# Patient Record
Sex: Male | Born: 1960 | Race: White | Hispanic: No | Marital: Married | Smoking: Former smoker
Health system: Southern US, Community
[De-identification: ages and names within clinical notes are randomized; demographics above are authoritative.]

## PROBLEM LIST (undated history)

## (undated) DIAGNOSIS — K219 Gastro-esophageal reflux disease without esophagitis: Secondary | ICD-10-CM

## (undated) DIAGNOSIS — M5136 Other intervertebral disc degeneration, lumbar region: Secondary | ICD-10-CM

## (undated) DIAGNOSIS — F1910 Other psychoactive substance abuse, uncomplicated: Secondary | ICD-10-CM

## (undated) DIAGNOSIS — E785 Hyperlipidemia, unspecified: Secondary | ICD-10-CM

## (undated) DIAGNOSIS — L719 Rosacea, unspecified: Secondary | ICD-10-CM

## (undated) DIAGNOSIS — S7290XA Unspecified fracture of unspecified femur, initial encounter for closed fracture: Secondary | ICD-10-CM

## (undated) DIAGNOSIS — T7840XA Allergy, unspecified, initial encounter: Secondary | ICD-10-CM

## (undated) DIAGNOSIS — S43006A Unspecified dislocation of unspecified shoulder joint, initial encounter: Secondary | ICD-10-CM

## (undated) DIAGNOSIS — G44009 Cluster headache syndrome, unspecified, not intractable: Secondary | ICD-10-CM

## (undated) DIAGNOSIS — J381 Polyp of vocal cord and larynx: Secondary | ICD-10-CM

## (undated) DIAGNOSIS — M51369 Other intervertebral disc degeneration, lumbar region without mention of lumbar back pain or lower extremity pain: Secondary | ICD-10-CM

## (undated) DIAGNOSIS — S83206A Unspecified tear of unspecified meniscus, current injury, right knee, initial encounter: Secondary | ICD-10-CM

## (undated) HISTORY — DX: Unspecified tear of unspecified meniscus, current injury, right knee, initial encounter: S83.206A

## (undated) HISTORY — DX: Unspecified fracture of unspecified femur, initial encounter for closed fracture: S72.90XA

## (undated) HISTORY — PX: COLONOSCOPY: SHX174

## (undated) HISTORY — PX: ARTHROSCOPIC REPAIR ACL: SUR80

## (undated) HISTORY — DX: Unspecified dislocation of unspecified shoulder joint, initial encounter: S43.006A

## (undated) HISTORY — DX: Rosacea, unspecified: L71.9

## (undated) HISTORY — DX: Other intervertebral disc degeneration, lumbar region: M51.36

## (undated) HISTORY — PX: ESOPHAGOGASTRODUODENOSCOPY (EGD) WITH ESOPHAGEAL DILATION: SHX5812

## (undated) HISTORY — DX: Hyperlipidemia, unspecified: E78.5

## (undated) HISTORY — PX: OTHER SURGICAL HISTORY: SHX169

## (undated) HISTORY — DX: Gastro-esophageal reflux disease without esophagitis: K21.9

## (undated) HISTORY — DX: Allergy, unspecified, initial encounter: T78.40XA

## (undated) HISTORY — DX: Polyp of vocal cord and larynx: J38.1

## (undated) HISTORY — DX: Other psychoactive substance abuse, uncomplicated: F19.10

## (undated) HISTORY — DX: Cluster headache syndrome, unspecified, not intractable: G44.009

## (undated) HISTORY — PX: UPPER GASTROINTESTINAL ENDOSCOPY: SHX188

## (undated) HISTORY — DX: Other intervertebral disc degeneration, lumbar region without mention of lumbar back pain or lower extremity pain: M51.369

---

## 1999-12-15 ENCOUNTER — Emergency Department (HOSPITAL_COMMUNITY): Admission: EM | Admit: 1999-12-15 | Discharge: 1999-12-15 | Payer: Self-pay | Admitting: Emergency Medicine

## 2003-12-13 ENCOUNTER — Ambulatory Visit: Payer: Self-pay | Admitting: Internal Medicine

## 2003-12-17 ENCOUNTER — Ambulatory Visit: Payer: Self-pay | Admitting: Internal Medicine

## 2004-04-16 ENCOUNTER — Ambulatory Visit: Payer: Self-pay | Admitting: Internal Medicine

## 2004-12-26 ENCOUNTER — Ambulatory Visit: Payer: Self-pay | Admitting: Internal Medicine

## 2006-11-17 ENCOUNTER — Encounter: Payer: Self-pay | Admitting: *Deleted

## 2006-11-17 DIAGNOSIS — S83509A Sprain of unspecified cruciate ligament of unspecified knee, initial encounter: Secondary | ICD-10-CM | POA: Insufficient documentation

## 2006-11-17 DIAGNOSIS — Z87898 Personal history of other specified conditions: Secondary | ICD-10-CM

## 2006-11-17 DIAGNOSIS — Z872 Personal history of diseases of the skin and subcutaneous tissue: Secondary | ICD-10-CM | POA: Insufficient documentation

## 2006-11-17 DIAGNOSIS — J301 Allergic rhinitis due to pollen: Secondary | ICD-10-CM | POA: Insufficient documentation

## 2006-11-17 DIAGNOSIS — S7290XA Unspecified fracture of unspecified femur, initial encounter for closed fracture: Secondary | ICD-10-CM | POA: Insufficient documentation

## 2006-11-17 DIAGNOSIS — K219 Gastro-esophageal reflux disease without esophagitis: Secondary | ICD-10-CM

## 2006-11-17 DIAGNOSIS — Z9189 Other specified personal risk factors, not elsewhere classified: Secondary | ICD-10-CM | POA: Insufficient documentation

## 2006-12-20 ENCOUNTER — Ambulatory Visit: Payer: Self-pay | Admitting: Internal Medicine

## 2006-12-20 DIAGNOSIS — R7989 Other specified abnormal findings of blood chemistry: Secondary | ICD-10-CM | POA: Insufficient documentation

## 2006-12-20 LAB — CONVERTED CEMR LAB
CO2: 28 meq/L (ref 19–32)
Calcium: 9.5 mg/dL (ref 8.4–10.5)
Chloride: 104 meq/L (ref 96–112)
Cholesterol: 196 mg/dL (ref 0–200)
Creatinine, Ser: 0.9 mg/dL (ref 0.4–1.5)
Glucose, Bld: 98 mg/dL (ref 70–99)
HDL: 31 mg/dL — ABNORMAL LOW (ref >39.0)
Sodium: 140 meq/L (ref 135–145)

## 2007-01-11 ENCOUNTER — Telehealth: Payer: Self-pay | Admitting: Internal Medicine

## 2007-08-20 ENCOUNTER — Encounter: Payer: Self-pay | Admitting: Internal Medicine

## 2007-08-23 ENCOUNTER — Telehealth: Payer: Self-pay | Admitting: Internal Medicine

## 2009-05-07 ENCOUNTER — Encounter: Payer: Self-pay | Admitting: Internal Medicine

## 2009-06-26 ENCOUNTER — Ambulatory Visit: Payer: Self-pay | Admitting: Internal Medicine

## 2009-06-26 LAB — CONVERTED CEMR LAB
ALT: 47 units/L (ref 0–53)
AST: 25 units/L (ref 0–37)
BUN: 14 mg/dL (ref 6–23)
Basophils Relative: 0.7 % (ref 0.0–3.0)
CO2: 31 meq/L (ref 19–32)
Calcium: 9.1 mg/dL (ref 8.4–10.5)
Chloride: 108 meq/L (ref 96–112)
Creatinine, Ser: 0.9 mg/dL (ref 0.4–1.5)
Eosinophils Relative: 2.1 % (ref 0.0–5.0)
Glucose, Bld: 89 mg/dL (ref 70–99)
HCT: 41.9 % (ref 39.0–52.0)
Hemoglobin: 14.6 g/dL (ref 13.0–17.0)
Ketones, ur: NEGATIVE mg/dL
Lymphs Abs: 1.7 10*3/uL (ref 0.7–4.0)
Monocytes Relative: 8.9 % (ref 3.0–12.0)
Platelets: 224 10*3/uL (ref 150.0–400.0)
RBC: 4.43 M/uL (ref 4.22–5.81)
Specific Gravity, Urine: 1.025 (ref 1.000–1.030)
TSH: 4 microintl units/mL (ref 0.35–5.50)
Total Bilirubin: 0.6 mg/dL (ref 0.3–1.2)
Total CHOL/HDL Ratio: 6
Total Protein: 6.6 g/dL (ref 6.0–8.3)
Triglycerides: 49 mg/dL (ref 0.0–149.0)
Urine Glucose: NEGATIVE mg/dL
Urobilinogen, UA: 0.2 (ref 0.0–1.0)
WBC: 5.2 10*3/uL (ref 4.5–10.5)
pH: 6 (ref 5.0–8.0)

## 2009-06-27 ENCOUNTER — Ambulatory Visit: Payer: Self-pay | Admitting: Internal Medicine

## 2009-06-27 DIAGNOSIS — N509 Disorder of male genital organs, unspecified: Secondary | ICD-10-CM | POA: Insufficient documentation

## 2009-08-02 ENCOUNTER — Encounter: Payer: Self-pay | Admitting: Internal Medicine

## 2010-02-25 NOTE — Assessment & Plan Note (Signed)
Summary: CPX/ NWS  #   Vital Signs:  Patient profile:   50 year old male Height:      70 inches (177.80 cm) Weight:      173.8 pounds (79 kg) BMI:     25.03 O2 Sat:      98 % on Room air Temp:     98.7 degrees F (37.06 degrees C) oral Pulse rate:   69 / minute BP sitting:   112 / 84  (left arm) Cuff size:   regular  Vitals Entered By: Orlan Leavens (June 27, 2009 2:28 PM)  O2 Flow:  Room air CC: CPX Is Patient Diabetic? No Pain Assessment Patient in pain? no        Primary Care Provider:  [facility]  CC:  CPX.  History of Present Illness: Patient presents for routine exam. In the interval he has had ACL right repair. He has got a "murder-cycle." He has otherwise been doing well.   Current Medications (verified): 1)  Fluticasone Propionate 50 Mcg/act Susp (Fluticasone Propionate) .... As Needed 2)  Minocycline Hcl 100 Mg  Tabs (Minocycline Hcl) .... Take One Tablet Once Daily 3)  Plexion Cleanser 10-5 %  Emul (Sulfacetamide Sodium-Sulfur) .... Use Daily 4)  Azelex 20 %  Crea (Azelaic Acid) .... Use Daily 5)  Loratadine 10 Mg  Tabs (Loratadine) .... Take One Tablet As Needed 6)  Prevacid Solutab 30 Mg  Tbdp (Lansoprazole) .Marland Kitchen.. 1 Once Daily  Allergies (verified): No Known Drug Allergies  Past History:  Past Medical History: Last updated: 11/17/2006 DRUG ABUSE, HX OF (ICD-V15.89) VOCAL CORD POLYP, HX OF (ICD-V13.8) HEADACHES, HX OF (ICD-V13.8) Hx of FRACTURE, FEMUR (ICD-821.00) Hx of ACL TEAR, RIGHT KNEE (ICD-844.2) ACNE ROSACEA, HX OF (ICD-V13.3) ALLERGIC RHINITIS, SEASONAL (ICD-477.0) GERD (ICD-530.81)    Family History: Last updated: 12/20/2006 father- prostate cancer, Lipids, CAD/CABG, Repair AAA, accidental  death at 63 Mother - dementia, OA-s/p RHR brother- good health 33 sister- good health 47  Social History: 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         he has fond a small hard nodule left testicle-inferior location  Physical Exam  General:  Well-developed,well-nourished,in no acute distress; alert,appropriate and cooperative throughout examination Head:  Normocephalic and atraumatic without obvious abnormalities. No apparent alopecia or balding. Eyes:  No corneal or conjunctival inflammation noted. EOMI. Perrla. Funduscopic exam benign, without hemorrhages, exudates or papilledema. Vision grossly normal. Ears:  External ear exam shows no significant lesions or deformities.  Otoscopic examination reveals clear canals, tympanic membranes are intact bilaterally without bulging, retraction, inflammation or discharge. Hearing is grossly normal bilaterally. Nose:  no external deformity and no external erythema.   Mouth:  Oral mucosa and oropharynx without lesions or exudates.  Teeth in good repair. Neck:  supple, no masses, no thyromegaly, and no carotid bruits.   Chest Wall:  No deformities, masses, tenderness or gynecomastia noted. Lungs:  Normal respiratory effort, chest expands symmetrically. Lungs are clear to auscultation, no crackles or wheezes. Heart:  Normal rate and regular rhythm. S1 and S2 normal without gallop, murmur, click, rub or other  extra sounds. Abdomen:  soft, non-tender, normal bowel sounds, no abdominal hernia, no inguinal hernia, and no hepatomegaly.   Rectal:  No external abnormalities noted. Normal sphincter tone. No rectal masses or tenderness. Genitalia:  right testicle normal. Left testicle  normal, normal vas, just inferior to the testicle is a 0.5 cm hard nodule which does feel attached to the testicle. Prostate:  Prostate gland firm and smooth, no enlargement, nodularity, tenderness, mass, asymmetry or induration. Msk:  normal ROM, no joint tenderness, no joint swelling, and no joint deformities.   Pulses:  2+ radial pulses Extremities:  No clubbing, cyanosis, edema, or deformity noted with normal full range of motion of all joints.   Neurologic:  No cranial nerve deficits noted. Station and gait are normal. Plantar reflexes are down-going bilaterally. DTRs are symmetrical throughout. Sensory, motor and coordinative functions appear intact. Skin:  turgor normal, color normal, no rashes, and no suspicious lesions.   Cervical Nodes:  no anterior cervical adenopathy and no posterior cervical adenopathy.   Inguinal Nodes:  no R inguinal adenopathy and no L inguinal adenopathy.   Psych:  Oriented X3, memory intact for recent and remote, normally interactive, good eye contact, and not anxious appearing.     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 Suspect a benign lesion but low degree of certainty.  Plan refer to Dr. Annabell Howells for GU evaluation  Orders: Urology Referral (Urology)  Problem # 2:  GERD (ICD-530.81) Doing OK. He has treid to stop prevacid and had terrible heartburn. Discussed the known acid rebound with cessation of PPI therapy.  Plan - drug holiday from PPI therapy: start H2 blocker, e.g. ranitidine 150 mg AM/PM, 1week prior to stopping prevacie. Stopp prevacid and watch for symptoms. If none then reduce ranitidine to at bedtime for several weeks then try taking only as needed.   His updated medication list for this problem includes:    Prevacid Solutab 30 Mg Tbdp (Lansoprazole) .Marland Kitchen... 1 once daily  Problem # 3:  OTHER ABNORMAL  BLOOD CHEMISTRY (ICD-790.6) Patient with elevated LDL at 156. Reviewed NCEP guidelines. He feels he is doing all he can do with diet and exercise.  Plan - trial of low dose lovastatin 20mg  once daily.           repeat lab in 4 weeks.   Problem # 4:  Preventive Health Care (ICD-V70.0) Medical history without new problems. He had a normal physical exam. Lab, except for LDL cholesterol, is within normal limits. Discussed the need for reular exercise and fitness.  In summary- a very nice man who is medically stable except for scrotal nodule and moderately elevated LDL cholesteol with plan as outlined above.  Complete Medication List: 1)  Fluticasone Propionate 50 Mcg/act Susp (Fluticasone propionate) .... As needed 2)  Minocycline Hcl 100 Mg Tabs (Minocycline hcl) .... Take one tablet once daily 3)  Plexion Cleanser 10-5 % Emul (Sulfacetamide sodium-sulfur) .... Use daily 4)  Azelex 20 % Crea (Azelaic acid) .... Use daily 5)  Loratadine 10 Mg Tabs (Loratadine) .... Take one tablet as needed 6)  Prevacid Solutab 30 Mg Tbdp (Lansoprazole) .Marland Kitchen.. 1 once daily 7)  Lovastatin 20 Mg Tabs (Lovastatin) .Marland Kitchen.. 1 by mouth qpm  Other Orders: Tdap => 62yrs IM (16109) Admin 1st Vaccine (60454) EKG w/ Interpretation (93000)  ient: Ajdin Hargan Note: All result statuses are Final unless  otherwise noted.  Tests: (1) BMP (METABOL)   Sodium                    144 mEq/L                   135-145   Potassium                 4.2 mEq/L                   3.5-5.1   Chloride                  108 mEq/L                   96-112   Carbon Dioxide            31 mEq/L                    19-32   Glucose                   89 mg/dL                    16-10   BUN                       14 mg/dL                    9-60   Creatinine                0.9 mg/dL                   4.5-4.0   Calcium                   9.1 mg/dL                   9.8-11.9   GFR                       97.52 mL/min                >60  Tests: (2)  Lipid Panel (LIPID)   Cholesterol          [H]  206 mg/dL                   1-478     ATP III Classification            Desirable:  < 200 mg/dL                    Borderline High:  200 - 239 mg/dL               High:  > = 240 mg/dL   Triglycerides             49.0 mg/dL                  2.9-562.1     Normal:  <150 mg/dL     Borderline High:  308 - 199 mg/dL   HDL                  [L]  65.78 mg/dL                 >46.96   VLDL Cholesterol  9.8 mg/dL                   1.6-10.9  CHO/HDL Ratio:  CHD Risk                             6                    Men          Women     1/2 Average Risk     3.4          3.3     Average Risk          5.0          4.4     2X Average Risk          9.6          7.1     3X Average Risk          15.0          11.0                           Tests: (3) CBC Platelet w/Diff (CBCD)   White Cell Count          5.2 K/uL                    4.5-10.5   Red Cell Count            4.43 Mil/uL                 4.22-5.81   Hemoglobin                14.6 g/dL                   60.4-54.0   Hematocrit                41.9 %                      39.0-52.0   MCV                       94.6 fl                     78.0-100.0   MCHC                      34.8 g/dL                   98.1-19.1   RDW                       12.6 %                      11.5-14.6   Platelet Count            224.0 K/uL                  150.0-400.0   Neutrophil %              55.9 %                      43.0-77.0   Lymphocyte %  32.4 %                      12.0-46.0   Monocyte %                8.9 %                       3.0-12.0   Eosinophils%              2.1 %                       0.0-5.0   Basophils %               0.7 %                       0.0-3.0   Neutrophill Absolute      2.9 K/uL                    1.4-7.7   Lymphocyte Absolute       1.7 K/uL                    0.7-4.0   Monocyte Absolute         0.5 K/uL                    0.1-1.0  Eosinophils, Absolute                              0.1 K/uL                    0.0-0.7   Basophils Absolute        0.0 K/uL                    0.0-0.1  Tests: (4) Hepatic/Liver Function Panel (HEPATIC)   Total Bilirubin           0.6 mg/dL                   4.0-3.4   Direct Bilirubin          0.1 mg/dL                   7.4-2.5   Alkaline Phosphatase      67 U/L                      39-117   AST                       25 U/L                      0-37   ALT                       47 U/L                      0-53   Total Protein             6.6 g/dL                    9.5-6.3   Albumin  4.3 g/dL                    0.2-7.2  Tests: (5) TSH (TSH)   FastTSH                   4.00 uIU/mL                 0.35-5.50  Tests: (6) Prostate Specific Antigen (PSA)   PSA-Hyb                   0.29 ng/mL                  0.10-4.00  Tests: (7) UDip Only (UDIP)   Color                     YELLOW       RANGE:  Yellow;Lt. Yellow   Clarity                   CLEAR                       Clear   Specific Gravity          1.025                       1.000 - 1.030   Urine Ph                  6.0                         5.0-8.0   Protein                   NEGATIVE                    Negative   Urine Glucose             NEGATIVE                    Negative   Ketones                   NEGATIVE                    Negative   Urine Bilirubin           NEGATIVE                    Negative   Blood                     NEGATIVE                    Negative   Urobilinogen              0.2                         0.0 - 1.0   Leukocyte Esterace        NEGATIVE                    Negative   Nitrite                   NEGATIVE  Negative  Tests: (8) Cholesterol LDL - Direct (DIRLDL)  Cholesterol LDL - Direct                             156.3 mg/dL     Optimal:  <295 mg/dL     Near or Above Optimal:  100-129 mg/dL     Borderline High:  621-308 mg/dL     High:  657-846 mg/dL     Very High:  >962 mg/dLPrescriptions: FLUTICASONE  PROPIONATE 50 MCG/ACT SUSP (FLUTICASONE PROPIONATE) as needed  #21mo x 12   Entered and Authorized by:   Jacques Navy MD   Signed by:   Jacques Navy MD on 06/27/2009   Method used:   Electronically to        [facility]  [address] [phone]* (retail)       [address]       [place], [place]  [address]       Ph: [phone]       Fax: [phone]   RxID:   [id number] LOVASTATIN 20 MG TABS (LOVASTATIN) 1 by mouth QPM  #30 x 12   Entered and Authorized by:   Jacques Navy MD   Signed by:   Jacques Navy MD on 06/27/2009   Method used:   Electronically to        [facility]  [address] [phone]* (retail)       [address]       [place], [place]  [address]       Ph: [phone]       Fax: [phone]   RxID:   [id number]    Immunizations Administered:  Tetanus Vaccine:    Vaccine Type: Tdap    Site: right deltoid    Mfr: GlaxoSmithKline    Dose: 0.5 ml    Route: IM    Given by: Orlan Leavens    Exp. Date: 04/20/2011    Lot #: [id number]    VIS given: 06/27/09

## 2010-02-25 NOTE — Letter (Signed)
Summary: Email from patient/[facility] Carroll  Email from patient/[facility] Carroll   Imported By: Edwin Carroll 05/22/2009 09:52:15  _____________________________________________________________________  External Attachment:    Type:   Image     Comment:   External Document

## 2010-02-25 NOTE — Consult Note (Signed)
Summary: [facility]   Imported By: Sherian Rein 08/09/2009 12:15:43  _____________________________________________________________________  External Attachment:    Type:   Image     Comment:   External Document

## 2010-04-07 ENCOUNTER — Telehealth: Payer: Self-pay | Admitting: Internal Medicine

## 2010-04-07 ENCOUNTER — Encounter: Payer: Self-pay | Admitting: Internal Medicine

## 2010-04-07 ENCOUNTER — Ambulatory Visit (INDEPENDENT_AMBULATORY_CARE_PROVIDER_SITE_OTHER): Payer: BC Managed Care – PPO | Admitting: Internal Medicine

## 2010-04-07 DIAGNOSIS — K9089 Other intestinal malabsorption: Secondary | ICD-10-CM

## 2010-04-09 ENCOUNTER — Other Ambulatory Visit: Payer: Self-pay | Admitting: Internal Medicine

## 2010-04-09 DIAGNOSIS — K909 Intestinal malabsorption, unspecified: Secondary | ICD-10-CM

## 2010-04-15 NOTE — Assessment & Plan Note (Signed)
Summary: bowel changes/Nausea/SD   Vital Signs:  Patient profile:   50 year old male Height:      70 inches Weight:      174 pounds BMI:     25.06 O2 Sat:      98 % on Room air Temp:     97.5 degrees F oral Pulse rate:   66 / minute BP sitting:   122 / 70  (left arm) Cuff size:   regular  Vitals Entered By: Bill Salinas CMA (April 07, 2010 4:14 PM)  O2 Flow:  Room air CC: pt has had c/o nausea with dizziness he also has c/o soft stools with an unusual odor/ ab   Primary Care Provider:  Aeon Koors  CC:  pt has had c/o nausea with dizziness he also has c/o soft stools with an unusual odor/ ab.  History of Present Illness: Mr. Gautier presents with a 4-6 week h/o oily, odifierous stools. He has had mild intermiitent indigestion. He denies any fatty food intolerance, right upper quadrant abdominal pain, right scapular pain. He has had no fever, melena, hematochezia. He has no prior GI history.   Current Medications (verified): 1)  Fluticasone Propionate 50 Mcg/act Susp (Fluticasone Propionate) .... As Needed 2)  Minocycline Hcl 100 Mg  Tabs (Minocycline Hcl) .... Take One Tablet Once Daily 3)  Plexion Cleanser 10-5 %  Emul (Sulfacetamide Sodium-Sulfur) .... Use Daily 4)  Azelex 20 %  Crea (Azelaic Acid) .... Use Daily 5)  Loratadine 10 Mg  Tabs (Loratadine) .... Take One Tablet As Needed 6)  Prevacid Solutab 30 Mg  Tbdp (Lansoprazole) .Marland Kitchen.. 1 Once Daily 7)  Lovastatin 20 Mg Tabs (Lovastatin) .Marland Kitchen.. 1 By Mouth Qpm  Allergies (verified): No Known Drug Allergies  Past History:  Past Medical History: Last updated: 11/17/2006 DRUG ABUSE, HX OF (ICD-V15.89) VOCAL CORD POLYP, HX OF (ICD-V13.8) HEADACHES, HX OF (ICD-V13.8) Hx of FRACTURE, FEMUR (ICD-821.00) Hx of ACL TEAR, RIGHT KNEE (ICD-844.2) ACNE ROSACEA, HX OF (ICD-V13.3) ALLERGIC RHINITIS, SEASONAL (ICD-477.0) GERD (ICD-530.81)    Past Surgical History: Last updated: 06/27/2009 excision vocal chord polyp ACL repair '11  Francena Hanly) FH reviewed for relevance, SH/Risk Factors reviewed for relevance  Review of Systems  The patient denies anorexia, fever, weight loss, weight gain, decreased hearing, peripheral edema, abdominal pain, melena, hematochezia, severe indigestion/heartburn, muscle weakness, abnormal bleeding, and enlarged lymph nodes.    Physical Exam  General:  Well-developed,well-nourished,in no acute distress; alert,appropriate and cooperative throughout examination Head:  normocephalic and atraumatic.   Eyes:  C&S clear without icterus Lungs:  normal respiratory effort.   Heart:  normal rate and regular rhythm.   Abdomen:  Hypoactive BS, minimal tenderness to deep palpation RUQ, minimal tenderness to percussion over the RUQ. No hepatomegaly. Neurologic:  alert & oriented X3 and gait normal.   Skin:  turgor normal and color normal.   Psych:  Oriented X3, normally interactive, and good eye contact.     Impression & Recommendations:  Problem # 1:  STEATORRHEA (ICD-579.8)  Patient with history/description of steatorrhea. He has minimal RUQ tenderness. Concern is for possible gallbladder disease vs malabsorption. No findings or stigmata of liver disease on today's exam.  Plan - GB u/s, if negative HIDA scan.           if above negative - stool for quantitative fat analysis           if above negative - GI lab series including hepatic panel  May come to GI consult.  Orders: Radiology Referral (Radiology)  Complete Medication List: 1)  Fluticasone Propionate 50 Mcg/act Susp (Fluticasone propionate) .... As needed 2)  Minocycline Hcl 100 Mg Tabs (Minocycline hcl) .... Take one tablet once daily 3)  Plexion Cleanser 10-5 % Emul (Sulfacetamide sodium-sulfur) .... Use daily 4)  Azelex 20 % Crea (Azelaic acid) .... Use daily 5)  Loratadine 10 Mg Tabs (Loratadine) .... Take one tablet as needed 6)  Prevacid Solutab 30 Mg Tbdp (Lansoprazole) .Marland Kitchen.. 1 once daily 7)  Lovastatin 20 Mg  Tabs (Lovastatin) .Marland Kitchen.. 1 by mouth qpm   Orders Added: 1)  Est. Patient Level III [10272] 2)  Radiology Referral [Radiology]

## 2010-04-15 NOTE — Progress Notes (Signed)
Summary: OV?   Phone Note Call from Patient Call back at [facility] Phone [phone]   Summary of Call: pt left vm - having some "stomach issues" feels he might need office visit?  Initial call taken by: Lamar Sprinkles, CMA,  April 07, 2010 12:48 PM  Follow-up for Phone Call        Spoke w/pt - he c/o months of mild nausea and change in stools. Has approx 2 stools daily that are soft and have strong odor. Overall feels "ok" but concerned about symptoms. Scheduled for office visit this pm.  Follow-up by: Lamar Sprinkles, CMA,  April 07, 2010 12:59 PM  Additional Follow-up for Phone Call Additional follow up Details #1::        k Additional Follow-up by: Jacques Navy MD,  April 07, 2010 1:13 PM

## 2010-04-16 ENCOUNTER — Ambulatory Visit
Admission: RE | Admit: 2010-04-16 | Discharge: 2010-04-16 | Disposition: A | Discharge status: Home / Self Care | Payer: BC Managed Care – PPO | Source: Ambulatory Visit | Attending: Internal Medicine | Admitting: Internal Medicine

## 2010-04-16 DIAGNOSIS — K909 Intestinal malabsorption, unspecified: Secondary | ICD-10-CM

## 2010-04-20 ENCOUNTER — Telehealth: Payer: Self-pay | Admitting: Internal Medicine

## 2010-04-20 DIAGNOSIS — K909 Intestinal malabsorption, unspecified: Secondary | ICD-10-CM

## 2010-04-20 NOTE — Telephone Encounter (Signed)
Patient with steatorrhea with normal GB u/s except for polyp. Next step is to evaluate gallbladder function.  Please call patient- GB u/s pretty normal except for polyp. Will order nuclear med study for gall bladder function next as discussed.

## 2010-04-21 ENCOUNTER — Telehealth: Payer: Self-pay | Admitting: *Deleted

## 2010-04-21 NOTE — Telephone Encounter (Signed)
Pt aware of u/s results, see other tele note

## 2010-04-21 NOTE — Telephone Encounter (Signed)
Pt aware.

## 2010-05-07 ENCOUNTER — Encounter (HOSPITAL_COMMUNITY)
Admission: RE | Admit: 2010-05-07 | Discharge: 2010-05-07 | Disposition: A | Discharge status: Home / Self Care | Payer: BC Managed Care – PPO | Source: Ambulatory Visit | Attending: Internal Medicine | Admitting: Internal Medicine

## 2010-05-07 DIAGNOSIS — K9089 Other intestinal malabsorption: Secondary | ICD-10-CM | POA: Insufficient documentation

## 2010-05-07 DIAGNOSIS — K909 Intestinal malabsorption, unspecified: Secondary | ICD-10-CM

## 2010-05-07 MED ORDER — SINCALIDE 5 MCG IJ SOLR: 0.0200 ug/kg | Freq: Once | INTRAMUSCULAR | Status: DC

## 2010-05-07 MED ORDER — TECHNETIUM TC 99M MEBROFENIN IV KIT
4.9000 | PACK | Freq: Once | INTRAVENOUS | Status: AC | PRN
  Administered 2010-05-07: 5 via INTRAVENOUS

## 2010-05-15 ENCOUNTER — Telehealth: Payer: Self-pay

## 2010-05-15 NOTE — Telephone Encounter (Signed)
Patient called requesting lab results from last week regarding gallbladder

## 2010-05-15 NOTE — Telephone Encounter (Signed)
Hepato-biliary scan was normal: normal gallbladder function: no evidence of gallbladder disease.

## 2010-05-16 NOTE — Telephone Encounter (Signed)
Left detailed vm on hm # (ok per HIPPA)

## 2010-05-16 NOTE — Telephone Encounter (Signed)
He can have a rov as needed: if his pain gets worse or persists an undue period of time

## 2010-05-16 NOTE — Telephone Encounter (Signed)
Patient notified and would like to know when you want to see him back. I looked at the 04/07/10 OV but not sure what to advise. Thanks

## 2010-05-27 HISTORY — PX: LUMBAR MICRODISCECTOMY: SHX99

## 2010-06-08 ENCOUNTER — Telehealth: Payer: Self-pay | Admitting: Internal Medicine

## 2010-06-08 NOTE — Telephone Encounter (Signed)
Please call patient - normal hepatobiliary scan. If he has continued discomfort will refer to GI

## 2010-06-09 NOTE — Telephone Encounter (Signed)
Informed pt of results.

## 2010-07-22 ENCOUNTER — Other Ambulatory Visit: Payer: Self-pay | Admitting: Internal Medicine

## 2010-09-30 ENCOUNTER — Encounter: Payer: Self-pay | Admitting: Internal Medicine

## 2010-09-30 ENCOUNTER — Encounter: Payer: Self-pay | Admitting: Gastroenterology

## 2010-09-30 ENCOUNTER — Ambulatory Visit (INDEPENDENT_AMBULATORY_CARE_PROVIDER_SITE_OTHER): Payer: BC Managed Care – PPO | Admitting: Internal Medicine

## 2010-09-30 DIAGNOSIS — R1310 Dysphagia, unspecified: Secondary | ICD-10-CM

## 2010-09-30 MED ORDER — ESOMEPRAZOLE MAGNESIUM 40 MG PO CPDR: 40.0000 mg | DELAYED_RELEASE_CAPSULE | Freq: Two times a day (BID) | ORAL | Status: DC

## 2010-09-30 NOTE — Progress Notes (Signed)
  Subjective:    Patient ID: Edwin Carroll, male    DOB: November 23, 1960, 50 y.o.   MRN: [id number]  HPI Patient presents for report of solid food dysphagia. He has had 5 episodes in 5 weeks, including the need to regurgitate. He cannot swallow liquids in the midst of a problem. He has a long history of GERD on PPI.   Past Medical History  Diagnosis Date  . Drug abuse   . Vocal cord polyp   . Headaches, cluster   . Femur fracture   . Tear of cartilage of right knee   . Rosacea   . Allergic rhinitis   . GERD (gastroesophageal reflux disease)    Past Surgical History  Procedure Date  . Excision vocal cord polyp   . Arthroscopic repair acl     Francena Hanly '11   Family History  Problem Relation Age of Onset  . Cancer Father   . Hyperlipidemia Father    History   Social History  . Marital Status: Married    Spouse Name: N/A    Number of Children: N/A  . Years of Education: N/A   Occupational History  . Not on file.   Social History Main Topics  . Smoking status: Not on file  . Smokeless tobacco: Not on file  . Alcohol Use: Not on file  . Drug Use: Not on file  . Sexually Active: Not on file   Other Topics Concern  . Not on file   Social History Narrative   AA- counsellingMarried 7 years-divorced, Married 2001Daughter 1982, son 1985Bicycles, plays tennisWork: Journalist, newspaper      Review of Systems System review is negative for any constitutional, cardiac, pulmonary,  or neuro symptoms or complaints     Objective:   Physical Exam Vitals stable Gen'l - WNWD white man in no distress Cor - RRR, 2+ pulses Abd - BS + Resp - normal       Assessment & Plan:  Dysphagia - patient with solid food dysphagia most likely due to GERD. He has had choking episodes.  Plan - refer to GI - to see Dr. Christella Hartigan Friday, Sept 7th           Given careful instructions about eating to avoid impaction.

## 2010-09-30 NOTE — Patient Instructions (Signed)
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 Plan - continue current medication. Very small bites, chew well, eat slowly and follow with sips of water. Will request an urgent appointment with GI - Sept 7th @ 11:00 with Dr. Wendall Papa   Esophageal Stricture (Narrowing) The esophagus is the long, narrow tube which carries food and liquid from the mouth to the stomach. Sometimes a part of the esophagus becomes narrow and makes it difficult, painful, or even impossible to swallow. This is called an esophageal stricture.   SYMPTOMS Some of the problems are difficulty swallowing or pain with swallowing. CAUSES Common causes of blockage or strictures of the esophagus are:  Exposure of the lower esophagus to the acid from the stomach may cause narrowing.   Hiatal hernia in which a small part of the stomach bulges up through the diaphragm can cause a narrowing in the bottom of the esophagus.   Scleroderma is a tissue disorder that affects the esophagus and makes swallowing difficult.   Achalasia is an absence of nerves in the lower esophagus and to the esophageal sphincter. This absence of nerves may be congenital (present since birth). This can cause irregular spasms which do not allow food and fluid through.   Strictures may develop from swallowing materials which damage the esophagus. Examples are acids or alkalis such as lye.   Schatzki's Ring is a narrow ring of non-cancerous tissue which narrows the lower esophagus. The cause of this is unknown.   Growths can block the esophagus.  DIAGNOSIS Your caregiver often suspects this problem by taking a medical history. They will also do a physical exam. They may then take X-rays and/or perform an endoscopy. Endoscopy is an exam in which a tube like a small flexible telescope is used to look at your esophagus.   TREATMENT AND PROCEDURE  One form of treatment is to dilate the narrow area. This  means to stretch it.   When this is not successful, chest surgery may be required. This is a much more extensive form of treatment with a longer recovery time.  Both of the above treatments make the passage of food and water into the stomach easier. They also make it easier for stomach contents to bubble back into the esophagus. Special medications may be used following the procedure to help prevent further narrowing. Medications may be used to lower the amount of acid in the stomach juice.   SEEK IMMEDIATE MEDICAL CARE IF:  Your swallowing is becoming more painful, difficult, or you are unable to swallow.   You vomit up blood.   You develop black tarry stools.   You develop chills or an unexplained fever of over 101 F (38.3 C).   You develop chest or abdominal pain.   You develop shortness of breath, feel lightheaded, or faint.  Follow up with medical care as your caregiver suggests. Document Released: 09/22/2005 Document Re-Released: 11/09/2006 [facility] Patient Information 2011 [place], [place].

## 2010-10-01 ENCOUNTER — Telehealth: Payer: Self-pay

## 2010-10-01 NOTE — Telephone Encounter (Signed)
Pt has been scheduled for EGD at [facility] on 10/02/10 he has been instructed and meds reviwed appt for 10/03/10 cx.

## 2010-10-01 NOTE — Telephone Encounter (Signed)
Message copied by Donata Duff on Wed Oct 01, 2010  2:35 PM ------      Message from: Rob Bunting P      Created: Wed Oct 01, 2010 11:23 AM       Gibril Mastro,            I read Dr. Alvera Novel H and P from yesterday, this patient is going to need EGD, likely with dilation.  He is scheduled to see me in office on Friday but can you call him to cancel that appt and instead I'd like to go straight to EGD at [facility] tomorrow (doesn't need propofol).  Can you call to see if he can make it and where he can fit into my schedule tomorrow.            Thanks                  Derryl Harbor.  I don't want him to have to wait too long for the EGD so we're going to streamline this a bit. Thanks for forwarding your note from yesterday.            dj

## 2010-10-02 ENCOUNTER — Telehealth: Payer: Self-pay

## 2010-10-02 ENCOUNTER — Ambulatory Visit (HOSPITAL_COMMUNITY)
Admission: RE | Admit: 2010-10-02 | Discharge: 2010-10-02 | Disposition: A | Discharge status: Home / Self Care | Payer: BC Managed Care – PPO | Source: Ambulatory Visit | Attending: Gastroenterology | Admitting: Gastroenterology

## 2010-10-02 ENCOUNTER — Encounter: Payer: BC Managed Care – PPO | Admitting: Gastroenterology

## 2010-10-02 DIAGNOSIS — K222 Esophageal obstruction: Secondary | ICD-10-CM | POA: Insufficient documentation

## 2010-10-02 DIAGNOSIS — K2100 Gastro-esophageal reflux disease with esophagitis, without bleeding: Secondary | ICD-10-CM | POA: Insufficient documentation

## 2010-10-02 DIAGNOSIS — Z79899 Other long term (current) drug therapy: Secondary | ICD-10-CM | POA: Insufficient documentation

## 2010-10-02 DIAGNOSIS — R1310 Dysphagia, unspecified: Secondary | ICD-10-CM

## 2010-10-02 NOTE — Telephone Encounter (Signed)
Message copied by Donata Duff on Thu Oct 02, 2010  3:27 PM ------      Message from: Rob Bunting P      Created: Thu Oct 02, 2010 12:46 PM       Rowin Bayron, can you set him up with repeat EGD in 2-3 weeks, [facility] for esophageal stricture, repeat dilation            Thanks                  Kathlene November,      FYI, no cancer.  Just a peptic related narrowing, esopahgitis.

## 2010-10-02 NOTE — Telephone Encounter (Signed)
I will call pt on Monday and get the EGD set up.  Pt had procedure today and is not available to schedule.

## 2010-10-03 ENCOUNTER — Ambulatory Visit: Payer: BC Managed Care – PPO | Admitting: Gastroenterology

## 2010-10-10 ENCOUNTER — Encounter: Payer: Self-pay | Admitting: Gastroenterology

## 2010-10-10 NOTE — Telephone Encounter (Signed)
Pt has been scheduled and instructed for repeat EGD with DIL letter mailed

## 2010-10-11 ENCOUNTER — Emergency Department (HOSPITAL_COMMUNITY)
Admission: EM | Admit: 2010-10-11 | Discharge: 2010-10-12 | Disposition: A | Discharge status: Home / Self Care | Payer: BC Managed Care – PPO | Attending: Emergency Medicine | Admitting: Emergency Medicine

## 2010-10-11 ENCOUNTER — Emergency Department (HOSPITAL_COMMUNITY): Payer: BC Managed Care – PPO

## 2010-10-11 DIAGNOSIS — S43109A Unspecified dislocation of unspecified acromioclavicular joint, initial encounter: Secondary | ICD-10-CM | POA: Insufficient documentation

## 2010-10-11 DIAGNOSIS — Z79899 Other long term (current) drug therapy: Secondary | ICD-10-CM | POA: Insufficient documentation

## 2010-10-11 DIAGNOSIS — L719 Rosacea, unspecified: Secondary | ICD-10-CM | POA: Insufficient documentation

## 2010-10-11 DIAGNOSIS — K219 Gastro-esophageal reflux disease without esophagitis: Secondary | ICD-10-CM | POA: Insufficient documentation

## 2010-10-11 DIAGNOSIS — Z9109 Other allergy status, other than to drugs and biological substances: Secondary | ICD-10-CM | POA: Insufficient documentation

## 2010-10-22 ENCOUNTER — Ambulatory Visit (AMBULATORY_SURGERY_CENTER): Payer: BC Managed Care – PPO | Admitting: Gastroenterology

## 2010-10-22 ENCOUNTER — Encounter: Payer: Self-pay | Admitting: Gastroenterology

## 2010-10-22 VITALS — Temp 97.7°F | Ht 70.0 in | Wt 175.0 lb

## 2010-10-22 DIAGNOSIS — K222 Esophageal obstruction: Secondary | ICD-10-CM | POA: Insufficient documentation

## 2010-10-22 MED ORDER — SODIUM CHLORIDE 0.9 % IV SOLN: 500.0000 mL | INTRAVENOUS | Status: DC

## 2010-10-23 ENCOUNTER — Telehealth: Payer: Self-pay

## 2010-10-23 NOTE — Telephone Encounter (Signed)

## 2011-01-22 ENCOUNTER — Other Ambulatory Visit: Payer: Self-pay | Admitting: *Deleted

## 2011-01-22 MED ORDER — FLUTICASONE PROPIONATE 50 MCG/ACT NA SUSP: 1.0000 | Freq: Every day | NASAL | Status: DC

## 2011-02-25 ENCOUNTER — Telehealth: Payer: Self-pay | Admitting: Internal Medicine

## 2011-02-25 NOTE — Telephone Encounter (Signed)
Pt states that he is having added stress in life, especially at work, and this is causing tension headaches; he is trying to work around his schedule to make appointment, and states that he had emailed you in 12.2012 concerning some of these matters. Reports he has used Fioricet in past successfully. Please advise.

## 2011-02-25 NOTE — Telephone Encounter (Signed)
Unfortunately e-mails to me do not get archived.  Standard treatment for tension headaches is the use of NSAIDs, e.g aleve, or ibuprofen. Fioricet is used primarily for migraine.  If he is having migraine type headache - ok for fioricet 1 q 6 prn, #30, 1 refill

## 2011-02-25 NOTE — Telephone Encounter (Signed)
Pt requesting headache med--[facility] [address]--

## 2011-02-25 NOTE — Telephone Encounter (Signed)
LMOM on VM for patient to inform more information needed as to what type of H/A and any other symptoms; left call back name & number.

## 2011-02-26 ENCOUNTER — Ambulatory Visit: Payer: BC Managed Care – PPO | Admitting: Internal Medicine

## 2011-02-26 MED ORDER — BUTALBITAL-APAP-CAFFEINE 50-325-40 MG PO TABS: 1.0000 | ORAL_TABLET | Freq: Four times a day (QID) | ORAL | Status: DC | PRN

## 2011-02-26 NOTE — Telephone Encounter (Signed)
Rx done. Patient informed.

## 2011-05-03 ENCOUNTER — Other Ambulatory Visit: Payer: Self-pay | Admitting: Internal Medicine

## 2011-05-04 ENCOUNTER — Other Ambulatory Visit: Payer: Self-pay | Admitting: Internal Medicine

## 2011-05-04 MED ORDER — BUTALBITAL-APAP-CAFFEINE 50-325-40 MG PO TABS: 1.0000 | ORAL_TABLET | Freq: Four times a day (QID) | ORAL | Status: DC | PRN

## 2011-06-15 ENCOUNTER — Other Ambulatory Visit: Payer: Self-pay | Admitting: Internal Medicine

## 2011-06-16 NOTE — Telephone Encounter (Signed)
Pharmacy request refill on medication for headache for patient. Fiorcet 50-325-40 mg. Please advise     Patient of Dr. Debby Bud

## 2011-06-18 ENCOUNTER — Other Ambulatory Visit: Payer: Self-pay | Admitting: *Deleted

## 2011-06-18 MED ORDER — BUTALBITAL-APAP-CAFFEINE 50-325-40 MG PO TABS: 1.0000 | ORAL_TABLET | Freq: Four times a day (QID) | ORAL | Status: DC | PRN

## 2011-06-18 NOTE — Telephone Encounter (Signed)
Medication refill sent to pharmacy.Fioricet

## 2011-06-25 ENCOUNTER — Telehealth: Payer: Self-pay | Admitting: Internal Medicine

## 2011-06-25 DIAGNOSIS — Z1211 Encounter for screening for malignant neoplasm of colon: Secondary | ICD-10-CM

## 2011-06-25 NOTE — Telephone Encounter (Signed)
Caller: Randy/Patient; PCP: Illene Regulus; CB#: [phone]; Call regarding Prescribed Fiorocet for Headaches and Helps But Under A. Lot of Stress and Anxiety r/t job and mother's health; HE WAS WONDERING IF MEDICATION CAN BE TRIED TO HELP WITH ANXIETY.  [facility] [address] He also wanted to mention that he has not had Colonoscopy Procedure done since turning 49. PLEASE GIVE HIM A CALL WITH ANY QUESTIONS/CONCERNS OR HAVE PHARMACY CALL IF MED CALLED IN.

## 2011-06-26 ENCOUNTER — Telehealth: Payer: Self-pay | Admitting: Internal Medicine

## 2011-06-26 MED ORDER — SERTRALINE HCL 50 MG PO TABS: 50.0000 mg | ORAL_TABLET | Freq: Every day | ORAL | Status: DC

## 2011-06-26 NOTE — Telephone Encounter (Signed)
For anxiety - may start sertraline 50 mg once a day, #30.  Order placed for colonoscopy

## 2011-06-26 NOTE — Telephone Encounter (Signed)
Returning office nurse call.

## 2011-06-26 NOTE — Telephone Encounter (Signed)
Left message on machine for pt to return my call  

## 2011-06-26 NOTE — Telephone Encounter (Signed)
Pt advised of Rx/pharmacy and referral.

## 2011-06-28 NOTE — Telephone Encounter (Signed)
i do not know what this is about

## 2011-06-29 ENCOUNTER — Encounter: Payer: Self-pay | Admitting: Gastroenterology

## 2011-06-29 ENCOUNTER — Telehealth: Payer: Self-pay | Admitting: Internal Medicine

## 2011-06-29 NOTE — Telephone Encounter (Signed)
Rx ordered 5/31 for seretraline for anxiety. Also placed order for colonoscopy

## 2011-06-29 NOTE — Telephone Encounter (Signed)
Caller: [name]/Patient; PCP: Illene Regulus; CB#: [phone];  Call regarding Anxiety;  Edwin Carroll has been having increased anxiety since April 2013.  Called office on 06/25/11 and Zoloft was called in.  Edwin Carroll did not pick up script from pharmacy because he would rather not take an anti-depressant.  Requesting something like Xanax to be used short term.  Denies symptoms at this time.  Utilized Anxiety Guideline.  See PCP within 72 hrs disposition.  No appts available today or tomorrow in EPIC.  Pt states that he would be able to come to office if needed but would prefer something to be called in.  Please f/u with Edwin Carroll.

## 2011-06-29 NOTE — Telephone Encounter (Signed)
Pt advised to start medication and to call back if there is no improvement in his sxs.

## 2011-07-01 ENCOUNTER — Other Ambulatory Visit: Payer: Self-pay | Admitting: Internal Medicine

## 2011-07-02 NOTE — Telephone Encounter (Signed)
Refill on flonase sent to [facility] pharmacy

## 2011-07-15 NOTE — Telephone Encounter (Signed)
Review of record reveals that there is a duplicate telephone encounter. The patient was advised on 5/31 that a prescription was called in and a referral order was placed by Dr. Debby Bud.

## 2011-07-30 ENCOUNTER — Other Ambulatory Visit: Payer: Self-pay | Admitting: Internal Medicine

## 2011-08-19 ENCOUNTER — Ambulatory Visit (AMBULATORY_SURGERY_CENTER): Payer: 59

## 2011-08-19 VITALS — Ht 70.0 in | Wt 173.7 lb

## 2011-08-19 DIAGNOSIS — Z1211 Encounter for screening for malignant neoplasm of colon: Secondary | ICD-10-CM

## 2011-08-19 MED ORDER — MOVIPREP 100 G PO SOLR: 1.0000 | Freq: Once | ORAL | Status: DC

## 2011-09-02 ENCOUNTER — Ambulatory Visit (AMBULATORY_SURGERY_CENTER): Payer: 59 | Admitting: Gastroenterology

## 2011-09-02 ENCOUNTER — Encounter: Payer: Self-pay | Admitting: Gastroenterology

## 2011-09-02 VITALS — BP 137/87 | HR 81 | Temp 97.9°F | Resp 17 | Ht 70.0 in | Wt 173.0 lb

## 2011-09-02 DIAGNOSIS — Z1211 Encounter for screening for malignant neoplasm of colon: Secondary | ICD-10-CM

## 2011-09-02 DIAGNOSIS — K5730 Diverticulosis of large intestine without perforation or abscess without bleeding: Secondary | ICD-10-CM

## 2011-09-02 MED ORDER — SODIUM CHLORIDE 0.9 % IV SOLN: 500.0000 mL | INTRAVENOUS | Status: DC

## 2011-09-02 NOTE — Patient Instructions (Addendum)
Discharge instructions given with verbal understanding. Handouts on diverticulosis and a high fiber diet given. Resume previous medications.YOU HAD AN ENDOSCOPIC PROCEDURE TODAY AT THE [facility]: Refer to the procedure report that was given to you for any specific questions about what was found during the examination.  If the procedure report does not answer your questions, please call your gastroenterologist to clarify.  If you requested that your care partner not be given the details of your procedure findings, then the procedure report has been included in a sealed envelope for you to review at your convenience later.  YOU SHOULD EXPECT: Some feelings of bloating in the abdomen. Passage of more gas than usual.  Walking can help get rid of the air that was put into your GI tract during the procedure and reduce the bloating. If you had a lower endoscopy (such as a colonoscopy or flexible sigmoidoscopy) you may notice spotting of blood in your stool or on the toilet paper. If you underwent a bowel prep for your procedure, then you may not have a normal bowel movement for a few days.  DIET: Your first meal following the procedure should be a light meal and then it is ok to progress to your normal diet.  A half-sandwich or bowl of soup is an example of a good first meal.  Heavy or fried foods are harder to digest and may make you feel nauseous or bloated.  Likewise meals heavy in dairy and vegetables can cause extra gas to form and this can also increase the bloating.  Drink plenty of fluids but you should avoid alcoholic beverages for 24 hours.  ACTIVITY: Your care partner should take you home directly after the procedure.  You should plan to take it easy, moving slowly for the rest of the day.  You can resume normal activity the day after the procedure however you should NOT DRIVE or use heavy machinery for 24 hours (because of the sedation medicines used during the test).    SYMPTOMS TO  REPORT IMMEDIATELY: A gastroenterologist can be reached at any hour.  During normal business hours, 8:30 AM to 5:00 PM Monday through Friday, call [phone].  After hours and on weekends, please call the GI answering service at [phone] who will take a message and have the physician on call contact you.   Following lower endoscopy (colonoscopy or flexible sigmoidoscopy):  Excessive amounts of blood in the stool  Significant tenderness or worsening of abdominal pains  Swelling of the abdomen that is new, acute  Fever of 100F or higher  FOLLOW UP: If any biopsies were taken you will be contacted by phone or by letter within the next 1-3 weeks.  Call your gastroenterologist if you have not heard about the biopsies in 3 weeks.  Our staff will call the home number listed on your records the next business day following your procedure to check on you and address any questions or concerns that you may have at that time regarding the information given to you following your procedure. This is a courtesy call and so if there is no answer at the home number and we have not heard from you through the emergency physician on call, we will assume that you have returned to your regular daily activities without incident.  SIGNATURES/CONFIDENTIALITY: You and/or your care partner have signed paperwork which will be entered into your electronic medical record.  These signatures attest to the fact that that the information above on your   After Visit Summary has been reviewed and is understood.  Full responsibility of the confidentiality of this discharge information lies with you and/or your care-partner.   

## 2011-09-02 NOTE — Progress Notes (Signed)
Procedure stopped at 8:56 to change from scope 09 to 1528. After 7.53 in colon.

## 2011-09-02 NOTE — Progress Notes (Signed)
Patient did not experience any of the following events: a burn prior to discharge; a fall within the facility; wrong site/side/patient/procedure/implant event; or a hospital transfer or hospital admission upon discharge from the facility. (G8907) Patient did not have preoperative order for IV antibiotic SSI prophylaxis. (G8918)  

## 2011-09-02 NOTE — Op Note (Signed)
[facility] [address] [place], [place]  [address]  COLONOSCOPY PROCEDURE REPORT  PATIENT:  Edwin Carroll, Edwin Carroll  MR#:  [id number] BIRTHDATE:  March 19, 1960, 50 yrs. old  GENDER:  male ENDOSCOPIST:  Rachael Fee, MD REF. BY:  Rosalyn Gess. Norins, M.D. PROCEDURE DATE:  09/02/2011 PROCEDURE:  Colonoscopy 08657 ASA CLASS:  Class II INDICATIONS:  Routine Risk Screening MEDICATIONS:   Fentanyl 100 mcg IV, Benadryl 25 mg IV, Versed 10 mg IV, These medications were titrated to patient response per physician's verbal order DESCRIPTION OF PROCEDURE:   After the risks benefits and alternatives of the procedure were thoroughly explained, informed consent was obtained.  Digital rectal exam was performed and revealed no rectal masses.   The LB [id number] endoscope was introduced through the anus and advanced to the cecum, which was identified by both the appendix and ileocecal valve, without limitations.  The quality of the prep was good..  The instrument was then slowly withdrawn as the colon was fully examined. <<PROCEDUREIMAGES>> FINDINGS:  Moderate diverticulosis was found in the sigmoid to descending colon segments (see image2).  This was otherwise a normal examination of the colon (see image4, image5, and image6). Retroflexed views in the rectum revealed no abnormalities. COMPLICATIONS:  None  ENDOSCOPIC IMPRESSION: 1) Moderate diverticulosis in the sigmoid to descending colon segments 2) Otherwise normal examination; no polyps or cancers  RECOMMENDATIONS: 1) You should continue to follow colorectal cancer screening guidelines for "routine risk" patients with a repeat colonoscopy in 10 years. There is no need for FOBT (stool) testing for at least 5 years. 2) Would use MAC sedation (deeper type of sedation) at next colonoscopy due to difficulty adequately sedating today.  REPEAT EXAM:  10 years  ______________________________ Rachael Fee, MD  n. eSIGNED:    Rachael Fee at 09/02/2011 09:14 AM  Micheline Chapman, [phone]

## 2011-09-03 ENCOUNTER — Telehealth: Payer: Self-pay | Admitting: *Deleted

## 2011-09-03 NOTE — Telephone Encounter (Signed)
No answer left message to call if questions or concerns. 

## 2011-09-25 ENCOUNTER — Other Ambulatory Visit: Payer: Self-pay | Admitting: Internal Medicine

## 2011-10-23 ENCOUNTER — Telehealth: Payer: Self-pay | Admitting: Internal Medicine

## 2011-10-23 ENCOUNTER — Other Ambulatory Visit: Payer: Self-pay | Admitting: Internal Medicine

## 2011-10-23 NOTE — Telephone Encounter (Signed)
Rx already refilled.

## 2011-10-23 NOTE — Telephone Encounter (Signed)
Caller: Rylin/Patient; Phone: [phone]; Reason for Call: Patient calling to get a refill on his Nexium, states has no more refills.  [facility] [address]  Please call him back.  Thanks

## 2011-10-26 ENCOUNTER — Other Ambulatory Visit: Payer: Self-pay | Admitting: Internal Medicine

## 2011-10-28 ENCOUNTER — Telehealth: Payer: Self-pay | Admitting: Internal Medicine

## 2011-10-28 NOTE — Telephone Encounter (Signed)
Patient notified of PA for Nexium is being reviewed by [employer] and will notify if approved

## 2011-10-28 NOTE — Telephone Encounter (Signed)
Caller: Kyheem/Patient; Patient Name: Edwin Carroll; PCP: Illene Regulus; Best Callback Phone Number: [phone]; Call regarding: Preauthorization form for Nexium; [facility] but [facility] Heck is unable to get it until the preauthorization is done for [employer]

## 2011-11-09 ENCOUNTER — Telehealth: Payer: Self-pay | Admitting: Internal Medicine

## 2011-11-09 NOTE — Telephone Encounter (Signed)
Caller: Casyn/Patient; Phone: [phone]; Reason for Call: Patient calling regarding approval for Nexium and states he faxed denial letter to office on 10/11 and wants to verify it was received.  They stated he would have to try something else like Protonix or other medications before they would approved Nexium.

## 2011-11-10 MED ORDER — PANTOPRAZOLE SODIUM 40 MG PO TBEC: 40.0000 mg | DELAYED_RELEASE_TABLET | Freq: Every day | ORAL | Status: DC

## 2011-11-10 NOTE — Telephone Encounter (Signed)
K. Protonix 40 mg eScribed to pharmacy

## 2011-11-11 NOTE — Telephone Encounter (Signed)
Patient notified of medication e-scribed to pharmacy.of protonix

## 2011-11-24 ENCOUNTER — Telehealth: Payer: Self-pay | Admitting: Internal Medicine

## 2011-11-24 NOTE — Telephone Encounter (Signed)
Caller: Randy/Patient; Patient Name: Edwin Carroll; PCP: Illene Regulus (Adults only); Best Callback Phone Number: [phone]; Call regarding: Heartburn; onset years ago; had used Nexium in the past with great sx relief, but insurance will no longer cover it; now using Protonix 2 tabs in the am, one Pepcid Complete in the evening and another at night; still wakes up at times, burping acid and takes Tums; has esophagus stretched 2x last fall and throat feeling similar to how it did at that time, with some difficulty swallowing; clearing throat; last appt 8/12; All emergent sxs of Heartburn protocol r/o except "provider diagnosed heartburn and sxs not responding to recommended treatment plan"; disp see within 24hrs; appt scheduled for 10/30 at 1630 with Dr.Norins

## 2011-11-25 ENCOUNTER — Ambulatory Visit: Payer: 59 | Admitting: Internal Medicine

## 2011-11-25 DIAGNOSIS — Z0289 Encounter for other administrative examinations: Secondary | ICD-10-CM

## 2012-01-01 ENCOUNTER — Ambulatory Visit (INDEPENDENT_AMBULATORY_CARE_PROVIDER_SITE_OTHER): Payer: 59 | Admitting: Gastroenterology

## 2012-01-01 ENCOUNTER — Encounter: Payer: Self-pay | Admitting: Gastroenterology

## 2012-01-01 VITALS — BP 138/96 | HR 100 | Ht 70.0 in | Wt 177.8 lb

## 2012-01-01 DIAGNOSIS — K219 Gastro-esophageal reflux disease without esophagitis: Secondary | ICD-10-CM

## 2012-01-01 MED ORDER — PANTOPRAZOLE SODIUM 40 MG PO TBEC: 40.0000 mg | DELAYED_RELEASE_TABLET | Freq: Two times a day (BID) | ORAL | Status: DC

## 2012-01-01 NOTE — Patient Instructions (Addendum)
You should change the way you are taking your antiacid medicine (protonix) so that you are taking it 20-30 minutes prior to a decent meal as that is the way the pill is designed to work most effectively. Take one pill twice a day (before breakfast and dinner meals). Change your pepcid so that it is taken immediately before bedtime. GERD handout given. Return to see Dr. Christella Hartigan in 5-6 weeks. Call sooner if needed.

## 2012-01-01 NOTE — Progress Notes (Signed)
Review of pertinent gastrointestinal problems: 1. Routine risk for colon cancer.  Colonoscopy 08/2011 found  diverticulosis only. No polyps. Recommended recall at 10 year interval 2. GERD 3. Peptic stricture dilated with EGD, balloon twice in 2012  HPI: This is a    very pleasant 51 year old man whom I last saw earlier this year time of the screening colonoscopy.  Was taking nexium twice daily and was feeling great. Then changed insurance who preferred protonix.  Taking protonix in AM, pepcid at night.   Just paid out of pocket for nexium twice daily.  Drinks coffee in AM 2 cups.  Eats dinner early, then does not lay down till 10.  The protonix he takes 45 min before breakfast.  Takes pepcid after dinner.  Also takes pepcid in daytime.    Past Medical History  Diagnosis Date  . Drug abuse   . Vocal cord polyp   . Headaches, cluster   . Femur fracture   . Tear of cartilage of right knee   . Rosacea   . Allergic rhinitis   . GERD (gastroesophageal reflux disease)   . Shoulder dislocation     Past Surgical History  Procedure Date  . Excision vocal cord polyp   . Arthroscopic repair acl     Francena Hanly '11  . Cervical disc surgery     Current Outpatient Prescriptions  Medication Sig Dispense Refill  . azelaic acid (AZELEX) 20 % cream Apply topically 2 (two) times daily. After skin is thoroughly washed and patted dry, gently but thoroughly massage a thin film of azelaic acid cream into the affected area twice daily, in the morning and evening.       . butalbital-acetaminophen-caffeine (FIORICET, ESGIC) 50-325-40 MG per tablet TAKE 1 TABLET BY MOUTH EVERY 6 HOURS AS NEEDED FOR HEADACHE  120 tablet  2  . cyclobenzaprine (FLEXERIL) 10 MG tablet       . fluticasone (FLONASE) 50 MCG/ACT nasal spray 1 SRPAY IN EACH NOSTRIL DAILY  16 g  3  . HYDROcodone-acetaminophen (VICODIN) 5-500 MG per tablet       . NEXIUM 40 MG capsule TAKE ONE CAPSULE BY MOUTH TWICE A DAY  60 capsule  10   . pantoprazole (PROTONIX) 40 MG tablet Take 1 tablet (40 mg total) by mouth daily.  30 tablet  3  . sertraline (ZOLOFT) 50 MG tablet Take 1 tablet (50 mg total) by mouth daily.  30 tablet  3  . Sulfacetamide Sodium-Sulfur (PLEXION CLEANSER) 10-5 % EMUL Apply topically.          Allergies as of 01/01/2012  . (No Known Allergies)    Family History  Problem Relation Age of Onset  . Cancer Father     Bladder  . Hyperlipidemia Father   . Colon cancer Neg Hx   . Diabetes Maternal Grandfather   . Diabetes Maternal Grandmother     History   Social History  . Marital Status: Married    Spouse Name: N/A    Number of Children: 2  . Years of Education: N/A   Occupational History  . [employer]    Social History Main Topics  . Smoking status: Former Smoker    Quit date: 04/19/1998  . Smokeless tobacco: Never Used  . Alcohol Use: No  . Drug Use: No  . Sexually Active: Not on file   Other Topics Concern  . Not on file   Social History Narrative   AA- counsellingMarried 7 years-divorced, Married 2001Daughter 1982,  son 1985Bicycles, plays tennisWork: Journalist, newspaper      Physical Exam: BP 138/96  Pulse 100  Ht 5\' 10"  (1.778 m)  Wt 177 lb 12.8 oz (80.65 kg)  BMI 25.51 kg/m2 Constitutional: generally well-appearing Psychiatric: alert and oriented x3 Abdomen: soft, nontender, nondistended, no obvious ascites, no peritoneal signs, normal bowel sounds     Assessment and plan: 51 y.o. male with  chronic GERD without alarm symptoms  he has pretty clear, chronic GERD. No alarm symptoms since stretching his esophagus last year. No weight loss. We discussed maximizing his medical therapy with proton pump inhibitor twice daily as well as bedtime H2 blocker. Nexium was cost prohibitive and so we are going to try propoxyphene see how that works out. He will return to see me in 4-6 weeks, he will call sooner if needed.

## 2012-02-09 ENCOUNTER — Ambulatory Visit: Payer: 59 | Admitting: Gastroenterology

## 2012-03-01 ENCOUNTER — Encounter: Payer: Self-pay | Admitting: Gastroenterology

## 2012-03-01 ENCOUNTER — Ambulatory Visit (INDEPENDENT_AMBULATORY_CARE_PROVIDER_SITE_OTHER): Payer: 59 | Admitting: Gastroenterology

## 2012-03-01 VITALS — BP 132/90 | HR 96 | Ht 70.0 in | Wt 179.5 lb

## 2012-03-01 DIAGNOSIS — K219 Gastro-esophageal reflux disease without esophagitis: Secondary | ICD-10-CM

## 2012-03-01 NOTE — Progress Notes (Signed)
Review of pertinent gastrointestinal problems:  1. Routine risk for colon cancer. Colonoscopy 08/2011 found diverticulosis only. No polyps. Recommended recall at 10 year interval  2. GERD 12/13 changed to maximum medical therapy with twice daily PPI (before BF and dinner) and bedtime H2 blocker 3. Peptic stricture dilated with EGD, balloon twice in 2012   HPI: This is a  very pleasant 51-year-old man whom I last saw about 2 months ago. He has had difficulty with GERD for 10-15 years at least.  Really burning a lot lately.  Takes PPI before BF and dinner meals.  H2 blocker at bedtime.    Overnight is better but during day still with extreme burning.  A lot of stress at work.  Non drinker, nonsmoker, infrequent caffeine in AM only.  No dysphagia anymore.  Past Medical History  Diagnosis Date  . Drug abuse   . Vocal cord polyp   . Headaches, cluster   . Femur fracture   . Tear of cartilage of right knee   . Rosacea   . Allergic rhinitis   . GERD (gastroesophageal reflux disease)   . Shoulder dislocation     Past Surgical History  Procedure Date  . Excision vocal cord polyp   . Arthroscopic repair acl     Kevin Supple '11  . Cervical disc surgery     Current Outpatient Prescriptions  Medication Sig Dispense Refill  . Azelaic Acid (FINACEA) 15 % cream Apply topically 2 (two) times daily. After skin is thoroughly washed and patted dry, gently but thoroughly massage a thin film of azelaic acid cream into the affected area twice daily, in the morning and evening.      . butalbital-acetaminophen-caffeine (FIORICET, ESGIC) 50-325-40 MG per tablet TAKE 1 TABLET BY MOUTH EVERY 6 HOURS AS NEEDED FOR HEADACHE  120 tablet  2  . cyclobenzaprine (FLEXERIL) 10 MG tablet       . Famotidine-Ca Carb-Mag Hydrox (PEPCID COMPLETE PO) Take by mouth at bedtime.      . fluticasone (FLONASE) 50 MCG/ACT nasal spray 1 SRPAY IN EACH NOSTRIL DAILY  16 g  3  . HYDROcodone-acetaminophen (VICODIN) 5-500  MG per tablet       . pantoprazole (PROTONIX) 40 MG tablet Take 1 tablet (40 mg total) by mouth 2 (two) times daily before a meal.  60 tablet  11  . Sulfacetamide Sodium-Sulfur (PLEXION CLEANSER) 10-5 % EMUL Apply topically.          Allergies as of 03/01/2012  . (No Known Allergies)    Family History  Problem Relation Age of Onset  . Cancer Father     Bladder  . Hyperlipidemia Father   . Colon cancer Neg Hx   . Diabetes Maternal Grandfather   . Diabetes Maternal Grandmother     History   Social History  . Marital Status: Married    Spouse Name: N/A    Number of Children: 2  . Years of Education: N/A   Occupational History  . [employer]    Social History Main Topics  . Smoking status: Former Smoker    Quit date: 04/19/1998  . Smokeless tobacco: Never Used  . Alcohol Use: No  . Drug Use: No  . Sexually Active: Not on file   Other Topics Concern  . Not on file   Social History Narrative   AA- counsellingMarried 7 years-divorced, Married 2001Daughter 1982, son 1985Bicycles, plays tennisWork: counselor fellowship      Physical Exam: BP 132/90  Pulse   96  Ht 5' 10" (1.778 m)  Wt 179 lb 8 oz (81.421 kg)  BMI 25.76 kg/m2 Constitutional: generally well-appearing Psychiatric: alert and oriented x3 Abdomen: soft, nontender, nondistended, no obvious ascites, no peritoneal signs, normal bowel sounds     Assessment and plan: 51 y.o. male with GERD  He has GERD symptoms despite maximum medical therapy with twice daily proton pump inhibitor as well as bedtime H2 blocker. I would like to repeat EGD as well as place a 48 hour pH probe to try to document Foley his acid exposure while he continues to stay on maximum medical therapy. I think we are heading towards surgical referral for consideration of a Nissen fundoplication for refractory GERD despite maximum medical therapy. He is going to try some samples of alternate proton pump inhibitor as well.  

## 2012-03-01 NOTE — Patient Instructions (Addendum)
You will be set up for an upper endoscopy for classic GERD symptoms despite maximum medical therapy (propofol). Bravo 48 hour pH monitoring placed during EGD to be scheduled (at [facility]). This will be on antiacid meds (twice daily PPI, nightly H2 blocker). Samples of alternate PPI given today to try prior to the EGD, pH test.  You have been scheduled for a 24 Hour PH Probe test at [facility] on 03/24/12 at 1245 pm. Please arrive 60 minutes prior to your procedure for registration. You will need to go to outpatient registration (1st floor of the hospital) first. Make certain to bring your insurance cards as well as a complete list of medications.  Please remember the following:  1) Nothing to eat or drink after 12:00 midnight on the night before your test.  2) Hold all diabetic medications/insulin the morning of the test. You may eat and take your medications after the test.  It will take at least 2 weeks to receive the results of this test from your physician. ------------------------------------------ ABOUT 24 HOUR PH PROBE An esophageal pH test measures and records the pH in your esophagus to determine if you have gastroesophageal reflux disease (GERD). The test can also be done to determine the effectiveness of medications or surgical treatment for GERD. What is esophageal reflux? Esophageal reflux is a condition in which stomach acid refluxes or moves back into the esophagus (the "food pipe" leading from the mouth to the stomach). How does the esophageal pH test work? A thin, small tube with an acid sensing device on the tip is gently passed through your nose, down the esophagus ("food tube"), and positioned about 2 inches above the lower esophageal sphincter. The tube is secured to the side of your face with clear tape. The end of the tube exiting from your nose is attached to a portable recorder that is worn on your belt or over your shoulder. The recorder has several buttons on it that  you will press to mark certain events. A nurse will review the monitoring instructions with you. Once the test has begun, what do I need to know and do? Activity: Follow your usual daily routine. Do not reduce or change your activities during the monitoring period. Doing so can make the monitoring results less useful.  Note: do not take a tub bath or shower; the equipment can't get wet.  Eating: Eat your regular meals at the usual times. If you do not eat during the monitoring period, your stomach will not produce acid as usual, and the test results will not be accurate. Eat at least 2 meals a day. Eat foods that tend to increase your symptoms (without making yourself miserable). Avoid snacking. Do not suck on hard candy or lozenges and do not chew gum during the monitoring period.  Lying down: Remain upright throughout the day. Do not lie down until you go to bed (unless napping or lying down during the day is part of your daily routine).  Medications: Continue to follow your doctor's advice regarding medications to avoid during the monitoring period.  Recording symptoms: Press the appropriate button on your recorder when symptoms occur (as discussed with the nurse).  Recording events: Record the time you start and stop eating and drinking (anything other than plain water). Record the time you lie down (even if just resting) and when you get back up. The nurse will explain this.  Unusual symptoms or side effects. If you think you may be experiencing any unusual  symptoms or side effects, call your doctor.  You will return the next day to have the tube removed. The information on the recorder will be downloaded to a computer and the results will be analyzed.  After completion of the study Resume your normal diet and medications. Lozenges or hard candy may help ease any sore throat caused by the tube.

## 2012-03-03 ENCOUNTER — Other Ambulatory Visit: Payer: Self-pay | Admitting: Internal Medicine

## 2012-03-07 ENCOUNTER — Telehealth: Payer: Self-pay | Admitting: Gastroenterology

## 2012-03-07 NOTE — Telephone Encounter (Signed)
Pt forgot to pick up samples of PPI and thought his procedure was going to be through his nose. I explained that he would have the procedure that is through his esophagus through the mouth not the nose.  Pt will pick up samples today

## 2012-03-10 ENCOUNTER — Encounter (HOSPITAL_COMMUNITY): Payer: Self-pay | Admitting: *Deleted

## 2012-03-10 NOTE — Pre-Procedure Instructions (Signed)
Your procedure is scheduled [id number], March 24, 2012 Report to [facility] Admitting [id number] Call this number if you have problems morning of your procedure:[phone]  Follow all bowel prep instructions per your doctor's orders.  Do not eat or drink anything after midnight the night before your procedure. You may brush your teeth, rinse out your mouth, but no water, no food, no chewing gum, no mints, no candies, no chewing tobacco.     Take these medicines the morning of your procedure with A SIP OF WATER:Protonix and Pepcid   Please make arrangements for a responsible person to drive you home after the procedure. You cannot go home by cab/taxi. We recommend you have someone with you at home the first 24 hours after your procedure. Driver for procedure is wife Candi  LEAVE ALL VALUABLES, JEWELRY, BILLFOLD AT HOME.  NO DENTURES, CONTACT LENSES ALLOWED IN THE ENDOSCOPY ROOM.   YOU MAY WEAR DEODORANT, PLEASE REMOVE ALL JEWELRY, WATCHES RINGS, BODY PIERCINGS AND LEAVE AT HOME.   WOMEN: NO MAKE-UP, LOTIONS PERFUMES

## 2012-03-16 ENCOUNTER — Telehealth: Payer: Self-pay | Admitting: Gastroenterology

## 2012-03-16 NOTE — Telephone Encounter (Signed)
Dr Christella Hartigan the pt feels much better on dexilant and does not feel he needs EGD that is scheduled.  Should he keep the appt or is it ok for him to cx?

## 2012-03-18 ENCOUNTER — Telehealth: Payer: Self-pay | Admitting: Internal Medicine

## 2012-03-18 NOTE — Telephone Encounter (Signed)
Ok for symptomatic treatment per CAN. If pt feeling worse either Saturday clinic or early next week with me.

## 2012-03-18 NOTE — Telephone Encounter (Signed)
[facility] Triage Call Report Triage Record Num: [id number] Operator: Levora Angel Patient Name: Edwin Carroll Call Date & Time: 03/17/2012 5:42:05PM Patient Phone: [phone] PCP: Illene Regulus Patient Gender: Male PCP Fax : [phone] Patient DOB: 03-19-1960 Practice Name: [facility] Reason for Call: Caller: [name]/Patient; PCP: Illene Regulus (Adults only); CB#: [phone]; Call regarding Cough/Congestion; Afebrile Onset: 03/14/12 started with sinus congestion. States 3rd one in 6 weeks. Urgent symptom of "Recent or recurrent episodes of sneezing, nasal congestion; watery nasal drainage; scratchy/itchy throat; red/itchy/water eyes or cough unrelieved after one week of home care measure" present per Upper Respiratory Infection. Patient has not been using saline washes or humidifier to treat symptoms. Home care advice given. Disposition see provider within 2 weeks. If no improvement with home care advice patient to follow up for appointment. Protocol(s) Used: Upper Respiratory Infection (URI) Recommended Outcome per Protocol: See Provider within 2 Weeks Reason for Outcome: Recent or recurrent episodes of sneezing, nasal congestion; watery nasal drainage; scratchy/itchy throat; red/itchy/watery eyes or cough unrelieved after one week of home care measures Care Advice: Use an air purifier, clean or change filters on heating/cooling units at least monthly. Maintaining humidity in home below 50%, helps prevent dust mites and mold. ~ Consider use of a saline nasal spray per package directions to help relieve nasal congestion. ~ SYMPTOM / CONDITION MANAGEMENT

## 2012-03-21 ENCOUNTER — Encounter (HOSPITAL_COMMUNITY): Payer: Self-pay | Admitting: Pharmacy Technician

## 2012-03-24 ENCOUNTER — Encounter (HOSPITAL_COMMUNITY): Payer: Self-pay | Admitting: Gastroenterology

## 2012-03-24 ENCOUNTER — Encounter (HOSPITAL_COMMUNITY)
Admission: RE | Disposition: A | Discharge status: Home / Self Care | Payer: Self-pay | Source: Ambulatory Visit | Attending: Gastroenterology

## 2012-03-24 ENCOUNTER — Telehealth: Payer: Self-pay

## 2012-03-24 ENCOUNTER — Ambulatory Visit (HOSPITAL_COMMUNITY)
Admission: RE | Admit: 2012-03-24 | Discharge: 2012-03-24 | Disposition: A | Discharge status: Home / Self Care | Payer: 59 | Source: Ambulatory Visit | Attending: Gastroenterology | Admitting: Gastroenterology

## 2012-03-24 ENCOUNTER — Ambulatory Visit (HOSPITAL_COMMUNITY): Payer: 59 | Admitting: Anesthesiology

## 2012-03-24 ENCOUNTER — Encounter (HOSPITAL_COMMUNITY): Payer: Self-pay | Admitting: Anesthesiology

## 2012-03-24 DIAGNOSIS — K208 Other esophagitis without bleeding: Secondary | ICD-10-CM

## 2012-03-24 DIAGNOSIS — K449 Diaphragmatic hernia without obstruction or gangrene: Secondary | ICD-10-CM | POA: Insufficient documentation

## 2012-03-24 DIAGNOSIS — Z79899 Other long term (current) drug therapy: Secondary | ICD-10-CM | POA: Insufficient documentation

## 2012-03-24 DIAGNOSIS — K2210 Ulcer of esophagus without bleeding: Secondary | ICD-10-CM

## 2012-03-24 DIAGNOSIS — K219 Gastro-esophageal reflux disease without esophagitis: Secondary | ICD-10-CM

## 2012-03-24 HISTORY — PX: ESOPHAGOGASTRODUODENOSCOPY (EGD) WITH PROPOFOL: SHX5813

## 2012-03-24 SURGERY — ESOPHAGOGASTRODUODENOSCOPY (EGD) WITH PROPOFOL
Anesthesia: Monitor Anesthesia Care

## 2012-03-24 MED ORDER — LACTATED RINGERS IV SOLN
INTRAVENOUS | Status: DC | PRN
  Administered 2012-03-24: 12:00:00 via INTRAVENOUS

## 2012-03-24 MED ORDER — SODIUM CHLORIDE 0.9 % IV SOLN: INTRAVENOUS | Status: DC

## 2012-03-24 MED ORDER — FENTANYL CITRATE 0.05 MG/ML IJ SOLN
INTRAMUSCULAR | Status: DC | PRN
  Administered 2012-03-24: 100 ug via INTRAVENOUS

## 2012-03-24 MED ORDER — MIDAZOLAM HCL 5 MG/5ML IJ SOLN
INTRAMUSCULAR | Status: DC | PRN
  Administered 2012-03-24: 2 mg via INTRAVENOUS

## 2012-03-24 MED ORDER — BUTAMBEN-TETRACAINE-BENZOCAINE 2-2-14 % EX AERO
INHALATION_SPRAY | CUTANEOUS | Status: DC | PRN
  Administered 2012-03-24: 1 via TOPICAL

## 2012-03-24 MED ORDER — PROPOFOL INFUSION 10 MG/ML OPTIME
INTRAVENOUS | Status: DC | PRN
  Administered 2012-03-24: 200 ug/kg/min via INTRAVENOUS

## 2012-03-24 SURGICAL SUPPLY — 14 items

## 2012-03-24 NOTE — Telephone Encounter (Signed)
Message copied by Donata Duff on Thu Mar 24, 2012  1:27 PM ------      Message from: Rob Bunting P      Created: Thu Mar 24, 2012  1:04 PM       He needs referral to general surgery to consider elective fundoplication surgery for chronic GERD, erosive esophagitis despite maximum medical therapy (twice daily PPI and nightly H2 blocker).  Thanks       ------

## 2012-03-24 NOTE — Op Note (Signed)
[facility] [address] [place], [address]   ENDOSCOPY PROCEDURE REPORT  PATIENT: Edwin Carroll, Edwin Carroll  MR#: [id number] BIRTHDATE: 1961-01-15 , 51  yrs. old GENDER: Male ENDOSCOPIST: Rachael Fee, MD PROCEDURE DATE:  03/24/2012 PROCEDURE:  EGD, diagnostic ASA CLASS:     Class II INDICATIONS:  GERD 12/13 changed to maximum medical therapy with twice daily PPI (before BF and dinner) and bedtime H2 blocker. MEDICATIONS: MAC sedation, administered by CRNA TOPICAL ANESTHETIC: Cetacaine Spray  DESCRIPTION OF PROCEDURE: After the risks benefits and alternatives of the procedure were thoroughly explained, informed consent was obtained.  The Pentax Gastroscope [id number] endoscope was introduced through the mouth and advanced to the second portion of the duodenum. Without limitations.  The instrument was slowly withdrawn as the mucosa was fully examined.    There was a 2cm sliding hiatal hernia.  There was typical appearing erosive esopahgitis (two linear spokes, each 3cm long) emanating from GE junction.  The examination was otherwise normal. Retroflexed views revealed no abnormalities.     The scope was then withdrawn from the patient and the procedure completed. COMPLICATIONS: There were no complications.  ENDOSCOPIC IMPRESSION: There was a 2cm sliding hiatal hernia. Linear erosive esophagitis (despite maximum medical GERD therapy) The examination was otherwise normal.  I was originally planning to place pH probe however given obvious, macroscopic evidence of persistent GERD and acid damage I did not think that was necessary.  RECOMMENDATIONS: My office will set up referral to general surgeon to consider elective fundoplication (anti-reflux surgery) given your persistent acid symptoms, macroscopic acid damage despite maximum medical acid therapy (twice daily PPI and nightly H2 blocker).   eSigned:  Rachael Fee, MD 03/24/2012 1:03 PM   CC: Wyonia Hough, MD

## 2012-03-24 NOTE — Anesthesia Postprocedure Evaluation (Signed)
  Anesthesia Post-op Note  Patient: Edwin Carroll  Procedure(s) Performed: Procedure(s) (LRB): ESOPHAGOGASTRODUODENOSCOPY (EGD) WITH PROPOFOL (N/A)  Patient Location: PACU  Anesthesia Type: MAC  Level of Consciousness: awake and alert   Airway and Oxygen Therapy: Patient Spontanous Breathing  Post-op Pain: mild  Post-op Assessment: Post-op Vital signs reviewed, Patient's Cardiovascular Status Stable, Respiratory Function Stable, Patent Airway and No signs of Nausea or vomiting  Last Vitals:  Filed Vitals:   03/24/12 1340  BP: 137/96  Temp:   Resp: 13    Post-op Vital Signs: stable   Complications: No apparent anesthesia complications

## 2012-03-24 NOTE — Telephone Encounter (Signed)
Referral has been made also the pt can call [email or web address] [phone]), Monday through Friday, 8:00 AM-6:00 PM ET, with any questions regarding  programs, eligibility requirements, or the application process  Left message on machine to call back

## 2012-03-24 NOTE — Transfer of Care (Signed)
Immediate Anesthesia Transfer of Care Note  Patient: Edwin Carroll  Procedure(s) Performed: Procedure(s): ESOPHAGOGASTRODUODENOSCOPY (EGD) WITH PROPOFOL (N/A)  Patient Location: PACU and Endoscopy Unit  Anesthesia Type:MAC  Level of Consciousness: awake, alert , oriented and patient cooperative  Airway & Oxygen Therapy: Patient Spontanous Breathing and Patient connected to nasal cannula oxygen  Post-op Assessment: Report given to PACU RN and Post -op Vital signs reviewed and stable  Post vital signs: Reviewed and stable  Complications: No apparent anesthesia complications

## 2012-03-24 NOTE — Preoperative (Signed)
Beta Blockers   Reason not to administer Beta Blockers:Not Applicable 

## 2012-03-24 NOTE — Telephone Encounter (Signed)
Edwin Carroll, He and his wife are very clear that Nexium helps his symptoms the best of any of the ppi's. But unsurance coverage is poor for him. Can you contact nexium rep and ask about how we can get him twice daily nexium at reasonable rate and let pt know what you find. Continue with general surgery referral as well. Thanks

## 2012-03-24 NOTE — Interval H&P Note (Signed)
History and Physical Interval Note:  03/24/2012 12:03 PM  Edwin Carroll  has presented today for surgery, with the diagnosis of GERD (gastroesophageal reflux disease) [530.81]  The various methods of treatment have been discussed with the patient and family. After consideration of risks, benefits and other options for treatment, the patient has consented to  Procedure(s) with comments: BRAVO PH STUDY (N/A) - 48 hour on antacids twice daily  ESOPHAGOGASTRODUODENOSCOPY (EGD) WITH PROPOFOL (N/A) as a surgical intervention .  The patient's history has been reviewed, patient examined, no change in status, stable for surgery.  I have reviewed the patient's chart and labs.  Questions were answered to the patient's satisfaction.     Rob Bunting

## 2012-03-24 NOTE — Anesthesia Preprocedure Evaluation (Signed)
Anesthesia Evaluation  Patient identified by MRN, date of birth, ID band Patient awake    Reviewed: Allergy & Precautions, H&P , NPO status , Patient's Chart, lab work & pertinent test results  Airway Mallampati: II TM Distance: >3 FB Neck ROM: Full    Dental no notable dental hx.    Pulmonary neg pulmonary ROS,  breath sounds clear to auscultation  Pulmonary exam normal       Cardiovascular negative cardio ROS  Rhythm:Regular Rate:Normal     Neuro/Psych  Headaches, negative psych ROS   GI/Hepatic Neg liver ROS, GERD-  Medicated,  Endo/Other  negative endocrine ROS  Renal/GU negative Renal ROS  negative genitourinary   Musculoskeletal negative musculoskeletal ROS (+)   Abdominal   Peds negative pediatric ROS (+)  Hematology negative hematology ROS (+)   Anesthesia Other Findings   Reproductive/Obstetrics negative OB ROS                           Anesthesia Physical Anesthesia Plan  ASA: II  Anesthesia Plan: MAC   Post-op Pain Management:    Induction: Intravenous  Airway Management Planned:   Additional Equipment:   Intra-op Plan:   Post-operative Plan:   Informed Consent: I have reviewed the patients History and Physical, chart, labs and discussed the procedure including the risks, benefits and alternatives for the proposed anesthesia with the patient or authorized representative who has indicated his/her understanding and acceptance.   Dental advisory given  Plan Discussed with: CRNA  Anesthesia Plan Comments:         Anesthesia Quick Evaluation

## 2012-03-24 NOTE — H&P (View-Only) (Signed)
Review of pertinent gastrointestinal problems:  1. Routine risk for colon cancer. Colonoscopy 08/2011 found diverticulosis only. No polyps. Recommended recall at 10 year interval  2. GERD 12/13 changed to maximum medical therapy with twice daily PPI (before BF and dinner) and bedtime H2 blocker 3. Peptic stricture dilated with EGD, balloon twice in 2012   HPI: This is a  very pleasant 52 year old man whom I last saw about 2 months ago. He has had difficulty with GERD for 10-15 years at least.  Really burning a lot lately.  Takes PPI before BF and dinner meals.  H2 blocker at bedtime.    Overnight is better but during day still with extreme burning.  A lot of stress at work.  Non drinker, nonsmoker, infrequent caffeine in AM only.  No dysphagia anymore.  Past Medical History  Diagnosis Date  . Drug abuse   . Vocal cord polyp   . Headaches, cluster   . Femur fracture   . Tear of cartilage of right knee   . Rosacea   . Allergic rhinitis   . GERD (gastroesophageal reflux disease)   . Shoulder dislocation     Past Surgical History  Procedure Date  . Excision vocal cord polyp   . Arthroscopic repair acl     Francena Hanly '11  . Cervical disc surgery     Current Outpatient Prescriptions  Medication Sig Dispense Refill  . Azelaic Acid (FINACEA) 15 % cream Apply topically 2 (two) times daily. After skin is thoroughly washed and patted dry, gently but thoroughly massage a thin film of azelaic acid cream into the affected area twice daily, in the morning and evening.      . butalbital-acetaminophen-caffeine (FIORICET, ESGIC) 50-325-40 MG per tablet TAKE 1 TABLET BY MOUTH EVERY 6 HOURS AS NEEDED FOR HEADACHE  120 tablet  2  . cyclobenzaprine (FLEXERIL) 10 MG tablet       . Famotidine-Ca Carb-Mag Hydrox (PEPCID COMPLETE PO) Take by mouth at bedtime.      . fluticasone (FLONASE) 50 MCG/ACT nasal spray 1 SRPAY IN EACH NOSTRIL DAILY  16 g  3  . HYDROcodone-acetaminophen (VICODIN) 5-500  MG per tablet       . pantoprazole (PROTONIX) 40 MG tablet Take 1 tablet (40 mg total) by mouth 2 (two) times daily before a meal.  60 tablet  11  . Sulfacetamide Sodium-Sulfur (PLEXION CLEANSER) 10-5 % EMUL Apply topically.          Allergies as of 03/01/2012  . (No Known Allergies)    Family History  Problem Relation Age of Onset  . Cancer Father     Bladder  . Hyperlipidemia Father   . Colon cancer Neg Hx   . Diabetes Maternal Grandfather   . Diabetes Maternal Grandmother     History   Social History  . Marital Status: Married    Spouse Name: N/A    Number of Children: 2  . Years of Education: N/A   Occupational History  . [employer]    Social History Main Topics  . Smoking status: Former Smoker    Quit date: 04/19/1998  . Smokeless tobacco: Never Used  . Alcohol Use: No  . Drug Use: No  . Sexually Active: Not on file   Other Topics Concern  . Not on file   Social History Narrative   AA- counsellingMarried 7 years-divorced, Married 2001Daughter 1982, son 1985Bicycles, plays tennisWork: Journalist, newspaper      Physical Exam: BP 132/90  Pulse  96  Ht 5\' 10"  (1.778 m)  Wt 179 lb 8 oz (81.421 kg)  BMI 25.76 kg/m2 Constitutional: generally well-appearing Psychiatric: alert and oriented x3 Abdomen: soft, nontender, nondistended, no obvious ascites, no peritoneal signs, normal bowel sounds     Assessment and plan: 52 y.o. male with GERD  He has GERD symptoms despite maximum medical therapy with twice daily proton pump inhibitor as well as bedtime H2 blocker. I would like to repeat EGD as well as place a 48 hour pH probe to try to document Foley his acid exposure while he continues to stay on maximum medical therapy. I think we are heading towards surgical referral for consideration of a Nissen fundoplication for refractory GERD despite maximum medical therapy. He is going to try some samples of alternate proton pump inhibitor as well.

## 2012-03-25 ENCOUNTER — Encounter (HOSPITAL_COMMUNITY): Payer: Self-pay | Admitting: Gastroenterology

## 2012-03-29 ENCOUNTER — Telehealth: Payer: Self-pay

## 2012-03-29 ENCOUNTER — Telehealth: Payer: Self-pay | Admitting: Gastroenterology

## 2012-03-29 NOTE — Telephone Encounter (Signed)
Pt was given the number for [employer] for Nexium and will call back with any futher concerns

## 2012-03-29 NOTE — Telephone Encounter (Signed)
Pt was given the information and will call with any further questions

## 2012-03-29 NOTE — Telephone Encounter (Signed)
Message copied by Donata Duff on Tue Mar 29, 2012 10:02 AM ------      Message from: Marnette Burgess      Created: Mon Mar 28, 2012 10:08 AM       Patient is scheduled to see Dr. Lodema Pilot on 04/01/12 @ 9:15am, arrive @ 8:45am.  If you have any questions please call [phone].            Thank You,      Elane Fritz      ----- Message -----         From: Donata Duff, CMA         Sent: 03/24/2012   1:28 PM           To: Blanca Nunez            Pt needs appt to discuss fundiplication       ------

## 2012-03-29 NOTE — Telephone Encounter (Signed)
Blanca to notify pt  

## 2012-04-01 ENCOUNTER — Ambulatory Visit (INDEPENDENT_AMBULATORY_CARE_PROVIDER_SITE_OTHER): Payer: 59 | Admitting: General Surgery

## 2012-04-20 ENCOUNTER — Ambulatory Visit (INDEPENDENT_AMBULATORY_CARE_PROVIDER_SITE_OTHER): Payer: 59 | Admitting: General Surgery

## 2012-04-20 ENCOUNTER — Telehealth: Payer: Self-pay | Admitting: Gastroenterology

## 2012-04-20 ENCOUNTER — Encounter (INDEPENDENT_AMBULATORY_CARE_PROVIDER_SITE_OTHER): Payer: Self-pay | Admitting: General Surgery

## 2012-04-20 VITALS — BP 140/88 | HR 96 | Temp 98.8°F | Resp 16 | Ht 70.0 in | Wt 176.8 lb

## 2012-04-20 DIAGNOSIS — K219 Gastro-esophageal reflux disease without esophagitis: Secondary | ICD-10-CM

## 2012-04-20 NOTE — Progress Notes (Signed)
Patient ID: Edwin Carroll, male   DOB: [birth year], 52 y.o.   MRN: [id number]  No chief complaint on file.   HPI Edwin Carroll is a 52 y.o. male.  This patient was referred by Dr. Christella Hartigan and Dr. Debby Bud for evaluation of long-standing reflux and esophagitis. He has had long-standing history of esophageal reflux which was initially responsive to Nexium daily. However due to insurance changes he was switched to Prevacid and this worked okay for a while but gradually had to be increased to twice daily dosing. He began to continue to have symptoms despite his twice daily PPIs and the switch back to Nexium again with good improvement of his symptoms. Eventually again due to insurance changes he needed to switch off of the Nexium and again developed symptoms of as well as an esophageal stricture in September of 2012 which was dilated by Dr. Christella Hartigan with good results. This was again repeated 6 weeks after and he was placed back on the Nexium. Eventually his insurance refused to pay for the Nexium and he now is on Protonix twice daily and Pepcid complete at night. He still has heartburn symptoms daily and takes TUMS frequently during the day. He wakes up coughing at night and clears his throat. He has burning in his chest followed by the sour taste of the reflux. He sleeps elevated. He denies any vomiting or pneumonia or other pulmonary symptoms. He's had several endoscopies with the most recent endoscopy in February of 2014 which demonstrated linear esophagitis despite being on twice daily Protonix. HPI  Past Medical History  Diagnosis Date  . Drug abuse   . Vocal cord polyp   . Headaches, cluster   . Femur fracture   . Tear of cartilage of right knee   . Rosacea   . Allergic rhinitis   . GERD (gastroesophageal reflux disease)   . Shoulder dislocation     Past Surgical History  Procedure Laterality Date  . Excision vocal cord polyp    . Arthroscopic repair acl      Edwin Carroll '11  . Cervical  disc surgery    . Esophagogastroduodenoscopy (egd) with esophageal dilation    . Esophagogastroduodenoscopy (egd) with propofol N/A 03/24/2012    Procedure: ESOPHAGOGASTRODUODENOSCOPY (EGD) WITH PROPOFOL;  Surgeon: Rachael Fee, MD;  Location: [facility];  Service: Endoscopy;  Laterality: N/A;    Family History  Problem Relation Age of Onset  . Cancer Father     Bladder  . Hyperlipidemia Father   . Colon cancer Neg Hx   . Diabetes Maternal Grandfather   . Diabetes Maternal Grandmother     Social History History  Substance Use Topics  . Smoking status: Former Smoker    Quit date: 04/19/1998  . Smokeless tobacco: Never Used  . Alcohol Use: No    No Known Allergies  Current Outpatient Prescriptions  Medication Sig Dispense Refill  . Azelaic Acid (FINACEA) 15 % cream Apply topically 2 (two) times daily. After skin is thoroughly washed and patted dry, gently but thoroughly massage a thin film of azelaic acid cream into the affected area twice daily, in the morning and evening.      . butalbital-acetaminophen-caffeine (FIORICET, ESGIC) 50-325-40 MG per tablet       . cyclobenzaprine (FLEXERIL) 10 MG tablet Take 10 mg by mouth 2 (two) times daily as needed for muscle spasms.       . Famotidine-Ca Carb-Mag Hydrox (PEPCID COMPLETE PO) Take by mouth at bedtime.      Marland Kitchen  fluticasone (FLONASE) 50 MCG/ACT nasal spray 1 SRPAY IN EACH NOSTRIL DAILY  16 g  3  . HYDROcodone-acetaminophen (NORCO) 10-325 MG per tablet Take 1 tablet by mouth 2 (two) times daily.      . pantoprazole (PROTONIX) 40 MG tablet Take 40 mg by mouth 2 (two) times daily before a meal.       No current facility-administered medications for this visit.    Review of Systems Review of Systems All other review of systems negative or noncontributory except as stated in the HPI  Height 5\' 10"  (1.778 m), weight 176 lb 12.8 oz (80.196 kg).  Physical Exam Physical Exam Physical Exam  Vitals reviewed. Constitutional: He  is oriented to person, place, and time. He appears well-developed and well-nourished. No distress.  HENT:  Head: Normocephalic and atraumatic.  Mouth/Throat: No oropharyngeal exudate.  Eyes: Conjunctivae and EOM are normal. Pupils are equal, round, and reactive to light. Right eye exhibits no discharge. Left eye exhibits no discharge. No scleral icterus.  Neck: Normal range of motion. No tracheal deviation present.  Cardiovascular: Normal rate, regular rhythm and normal heart sounds.   Pulmonary/Chest: Effort normal and breath sounds normal. No stridor. No respiratory distress. He has no wheezes. He has no rales. He exhibits no tenderness.  Abdominal: Soft. Bowel sounds are normal. He exhibits no distension and no mass. There is no tenderness. There is no rebound and no guarding.  Musculoskeletal: Normal range of motion. He exhibits no edema and no tenderness.  Neurological: He is alert and oriented to person, place, and time.  Skin: Skin is warm and dry. No rash noted. He is not diaphoretic. No erythema. No pallor.  Psychiatric: He has a normal mood and affect. His behavior is normal. Judgment and thought content normal.    Data Reviewed   Assessment    Esophageal reflux, refractory to PPIs He has pretty classic symptoms for severe gastroesophageal reflux. I think he has been on maximal medical therapy and has shown failure with this. He has documented esophagitis despite being on twice daily PPIs. I think that he would be a fine candidate for Nissen fundoplication due to failed medical treatment. We discussed the procedure and the risks of this including bloating, dysphagia, persistent symptoms, gastroparesis, injury to vagus nerves and surrounding structures, and the need for repeat surgery. He is interested in continuing with fundoplication and I have recommended esophageal manometry and upper GI preoperatively. I would like to have pH probe as well as a baseline study but I did not feel  that this is mandatory given the endoscopic confirmation of esophagitis. We will set him up for upper GI and manometry and I will see him back after this to discuss results and to further discuss the surgery    Plan    UGI, manometry        Savier Trickett DAVID 04/20/2012, 2:01 PM

## 2012-04-20 NOTE — Telephone Encounter (Signed)
Is this ok to set up Dr Christella Hartigan?

## 2012-04-21 ENCOUNTER — Other Ambulatory Visit: Payer: Self-pay

## 2012-04-21 ENCOUNTER — Telehealth: Payer: Self-pay

## 2012-04-21 DIAGNOSIS — K219 Gastro-esophageal reflux disease without esophagitis: Secondary | ICD-10-CM

## 2012-04-21 MED ORDER — ESOMEPRAZOLE MAGNESIUM 40 MG PO PACK: 40.0000 mg | PACK | Freq: Two times a day (BID) | ORAL | Status: DC

## 2012-04-21 NOTE — Telephone Encounter (Signed)
Pt called back to say that he does not want to have any further testing done, he says that all of his issues started when his insurance would not cover nexium any longer.  He called his insurance and they said Nexium powder will be covered.  He is call ing Dr Biagio Quint to advise his office he does not wish to have any surgery or test.  Can we send in the Nexium powder for him to try ?

## 2012-04-21 NOTE — Telephone Encounter (Signed)
Dr Christella Hartigan the pt states he needs manometry NOT Bravo  PH  Dr Delice Lesch nurse states manometry as well  Please advise

## 2012-04-21 NOTE — Telephone Encounter (Signed)
Carolynn Serve set up pH testing  Jimesha Rising, He needs EGD at [facility] with Bravo pH probe placement. Moderate sedation ok. This should be off anti acid meds. Want to prove he has acid regurg.  ----- Message ----- From: Lodema Pilot, DO Sent: 04/20/2012 5:45 PM To: Rachael Fee, MD, Jacques Navy, MD

## 2012-04-21 NOTE — Telephone Encounter (Signed)
Pt is aware and med has been sent

## 2012-04-21 NOTE — Telephone Encounter (Signed)
Pt has been notified that the appt has been changed to a manometry for 04/25/12 1230 pm

## 2012-04-21 NOTE — Telephone Encounter (Signed)
Brian, We'll set up pH testing  Carle Dargan, He needs EGD at [facility] with Bravo pH probe placement. Moderate sedation ok. This should be off anti acid meds. Want to prove he has acid regurg.  ----- Message ----- From: Brian Layton, DO Sent: 04/20/2012 5:45 PM To: Daniel P Jacobs, MD, Michael E Norins, MD   

## 2012-04-21 NOTE — Telephone Encounter (Signed)
See alternate note  

## 2012-04-21 NOTE — Telephone Encounter (Signed)
Yes, also bravo pH placement on same day via EGD, moderate sedation. Thanks

## 2012-04-21 NOTE — Telephone Encounter (Signed)
Yes  40mg  nexium twice daily, disp one month, 11 refills  Thanks

## 2012-04-21 NOTE — Telephone Encounter (Signed)
Pt has been scheduled for a EGD with Bravo placement Left message on machine to call back

## 2012-04-21 NOTE — Telephone Encounter (Signed)
OK, I re-read his note and now see.  Manometry only.  thanks

## 2012-04-22 ENCOUNTER — Encounter: Payer: Self-pay | Admitting: Gastroenterology

## 2012-04-25 ENCOUNTER — Ambulatory Visit (HOSPITAL_COMMUNITY): Admission: RE | Admit: 2012-04-25 | Payer: 59 | Source: Ambulatory Visit | Admitting: Gastroenterology

## 2012-04-25 ENCOUNTER — Encounter (HOSPITAL_COMMUNITY): Admission: RE | Payer: Self-pay | Source: Ambulatory Visit

## 2012-04-25 SURGERY — MANOMETRY, ESOPHAGUS

## 2012-05-04 ENCOUNTER — Other Ambulatory Visit: Payer: Self-pay | Admitting: Internal Medicine

## 2012-05-26 ENCOUNTER — Encounter (INDEPENDENT_AMBULATORY_CARE_PROVIDER_SITE_OTHER): Payer: 59 | Admitting: General Surgery

## 2012-07-19 ENCOUNTER — Other Ambulatory Visit: Payer: Self-pay | Admitting: Internal Medicine

## 2012-07-20 NOTE — Telephone Encounter (Signed)
RX called in to pt pharmacy. Thanks

## 2012-09-15 ENCOUNTER — Encounter: Payer: 59 | Admitting: Internal Medicine

## 2012-10-27 ENCOUNTER — Other Ambulatory Visit: Payer: Self-pay | Admitting: Internal Medicine

## 2012-11-15 IMAGING — NM NM HEPATOBILIARY IMAGE, INC GB
2 series · 12 of 12 positions shown · non-contrast
Comparison: Ultrasound of the abdomen of 04/16/2010

***ADDENDUM*** CREATED: 06/04/2010 [DATE]

Due to an administrative error, the original report had the
incorrect exam title.  Please see below for the corrected exam
title:
NM HEPATOBILIARY INCLUDE GB
***END ADDENDUM*** SIGNED BY: Nardo Ralda, M.D.
CLINICAL DATA: Steatorrhea
RADIOLOGY EXAMINATION
Radiopharmaceutical: 4.9 mCi of technetium 99m Choletec

[Series 1: he hepato · 4.71mm/px · 6 of 60 frames shown (1 of 2)]
[frame 6/60]
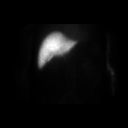
[frame 16/60]
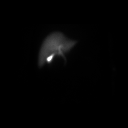
[frame 26/60]
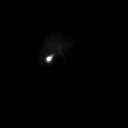
[frame 36/60]
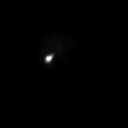
[frame 46/60]
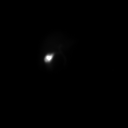
[frame 56/60]
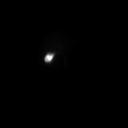

[Series 1: he hepato · 4.71mm/px · 6 of 30 frames shown (2 of 2)]
[frame 3/30]
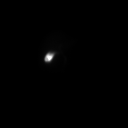
[frame 8/30]
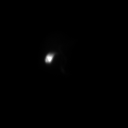
[frame 13/30]
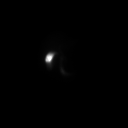
[frame 18/30]
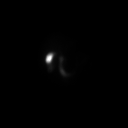
[frame 23/30]
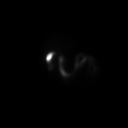
[frame 28/30]
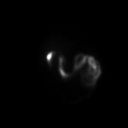

[12 of 12 positions shown; findings below may reference images not displayed]

FINDINGS: The patient was injected with 4.9 mCi of technetium 99m
Choletec intravenously and imaging over the abdomen was performed.
Radionuclide is noted throughout the liver and there is excretion
into the intrahepatic ductal system.  There is visualization of the
common bile duct and gallbladder with visualization of small bowel.
This represents a normal hepatobiliary scan.

The patient was then given 1.6 mcg of CCK intravenously and imaging
over the gallbladder was performed.  The ejection fraction is
measured at 84.5% which is well within normal limits.
IMPRESSION: 1.  Normal nuclear medicine hepatobiliary scan.
2.  Normal gallbladder ejection fraction of 84.5%.

## 2012-11-29 ENCOUNTER — Encounter: Payer: 59 | Admitting: Internal Medicine

## 2012-12-15 ENCOUNTER — Encounter: Payer: 59 | Admitting: Internal Medicine

## 2013-01-16 ENCOUNTER — Other Ambulatory Visit: Payer: Self-pay | Admitting: Internal Medicine

## 2013-02-27 ENCOUNTER — Other Ambulatory Visit: Payer: Self-pay | Admitting: Internal Medicine

## 2013-03-09 ENCOUNTER — Encounter: Payer: Self-pay | Admitting: Internal Medicine

## 2013-03-09 ENCOUNTER — Other Ambulatory Visit (INDEPENDENT_AMBULATORY_CARE_PROVIDER_SITE_OTHER): Payer: No Typology Code available for payment source

## 2013-03-09 ENCOUNTER — Ambulatory Visit (INDEPENDENT_AMBULATORY_CARE_PROVIDER_SITE_OTHER): Payer: No Typology Code available for payment source | Admitting: Internal Medicine

## 2013-03-09 ENCOUNTER — Other Ambulatory Visit: Payer: Self-pay | Admitting: Internal Medicine

## 2013-03-09 VITALS — BP 136/98 | HR 81 | Temp 97.8°F | Resp 16 | Ht 70.0 in | Wt 174.0 lb

## 2013-03-09 DIAGNOSIS — Z0000 Encounter for general adult medical examination without abnormal findings: Secondary | ICD-10-CM

## 2013-03-09 DIAGNOSIS — M5137 Other intervertebral disc degeneration, lumbosacral region: Secondary | ICD-10-CM

## 2013-03-09 DIAGNOSIS — Z872 Personal history of diseases of the skin and subcutaneous tissue: Secondary | ICD-10-CM

## 2013-03-09 DIAGNOSIS — M5136 Other intervertebral disc degeneration, lumbar region: Secondary | ICD-10-CM | POA: Insufficient documentation

## 2013-03-09 DIAGNOSIS — K219 Gastro-esophageal reflux disease without esophagitis: Secondary | ICD-10-CM

## 2013-03-09 DIAGNOSIS — E785 Hyperlipidemia, unspecified: Secondary | ICD-10-CM

## 2013-03-09 LAB — COMPREHENSIVE METABOLIC PANEL
ALK PHOS: 79 U/L (ref 39–117)
ALT: 69 U/L — ABNORMAL HIGH (ref 0–53)
AST: 32 U/L (ref 0–37)
Albumin: 4.9 g/dL (ref 3.5–5.2)
BUN: 10 mg/dL (ref 6–23)
CALCIUM: 9.6 mg/dL (ref 8.4–10.5)
CHLORIDE: 104 meq/L (ref 96–112)
CO2: 29 mEq/L (ref 19–32)
CREATININE: 0.8 mg/dL (ref 0.4–1.5)
GFR: 115.58 mL/min (ref >60.00)
Glucose, Bld: 102 mg/dL — ABNORMAL HIGH (ref 70–99)
POTASSIUM: 4.4 meq/L (ref 3.5–5.1)
Sodium: 140 mEq/L (ref 135–145)
Total Bilirubin: 0.8 mg/dL (ref 0.3–1.2)
Total Protein: 7.8 g/dL (ref 6.0–8.3)

## 2013-03-09 LAB — LIPID PANEL
Cholesterol: 246 mg/dL — ABNORMAL HIGH (ref 0–200)
HDL: 41.5 mg/dL (ref >39.00)
Total CHOL/HDL Ratio: 6
Triglycerides: 123 mg/dL (ref 0.0–149.0)
VLDL: 24.6 mg/dL (ref 0.0–40.0)

## 2013-03-09 LAB — HEMOGLOBIN AND HEMATOCRIT, BLOOD
HEMATOCRIT: 46.6 % (ref 39.0–52.0)
HEMOGLOBIN: 15.6 g/dL (ref 13.0–17.0)

## 2013-03-09 LAB — LDL CHOLESTEROL, DIRECT: LDL DIRECT: 191 mg/dL

## 2013-03-09 MED ORDER — OMEPRAZOLE 40 MG PO CPDR: 40.0000 mg | DELAYED_RELEASE_CAPSULE | Freq: Every day | ORAL | Status: DC

## 2013-03-09 NOTE — Progress Notes (Signed)
Pre visit review using our clinic review tool, if applicable. No additional management support is needed unless otherwise documented below in the visit note. 

## 2013-03-09 NOTE — Patient Instructions (Signed)
Thanks for coming in.  Your exam is normal and you look good for all things considered.  Will check routine labs today and results will be posted to MyChart  Due to the controlled substances you are prescribed will execute a controlled substance contract and monitoring.  Will change to full strength omeprazole - if there is push back we can do a prior authorization.  Will transfer your care to Dr. Yetta BarreJones.
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
## 2013-03-10 DIAGNOSIS — Z0000 Encounter for general adult medical examination without abnormal findings: Secondary | ICD-10-CM | POA: Insufficient documentation

## 2013-03-10 DIAGNOSIS — E785 Hyperlipidemia, unspecified: Secondary | ICD-10-CM | POA: Insufficient documentation

## 2013-03-10 NOTE — Assessment & Plan Note (Signed)
Stable at this time with normal ADLs and activity.

## 2013-03-10 NOTE — Assessment & Plan Note (Signed)
Lab reveals LDL 191 up from 156 at last lab  Plan Patient to return to discuss medical therapy

## 2013-03-10 NOTE — Assessment & Plan Note (Signed)
nasal steroid (flonase) one puff in each nare once a day. On this regimen he is well controlled. No dysphagia at this time  Plan Rx for omeprazole 40 mg bid.

## 2013-03-10 NOTE — Assessment & Plan Note (Signed)
Interval history is benign. Physical exam is normal. Lab reviewed - marked hyperlipidemia. He is current with colorectal cancer screening. Discussed pros and cons of prostate cancer screening (USPHCTF recommendations reviewed and AUA  April '13 recommendations) and he defers evaluation at this time. Immunization - current with Tetanus.  In summary A very nice man who is medically stable except for elevated LDL cholesterol for which he will return to discuss treatment options.

## 2013-03-10 NOTE — Assessment & Plan Note (Signed)
Stable. Follows with dermatology

## 2013-03-12 ENCOUNTER — Encounter: Payer: Self-pay | Admitting: Internal Medicine

## 2013-03-15 ENCOUNTER — Telehealth: Payer: Self-pay

## 2013-03-15 NOTE — Telephone Encounter (Signed)
Message copied by Newell CoralWILLIAMS, Grenda Lora J on Wed Mar 15, 2013  4:45 PM ------      Message from: Jacques NavyNORINS, MICHAEL E      Created: Fri Mar 10, 2013  5:30 PM       Needs appointment to discuss medical therapy for cholesterol ------

## 2013-03-15 NOTE — Telephone Encounter (Signed)
Patient scheduled for next week.  Thanks!

## 2013-03-22 ENCOUNTER — Ambulatory Visit: Payer: No Typology Code available for payment source | Admitting: Internal Medicine

## 2013-03-27 ENCOUNTER — Ambulatory Visit: Payer: No Typology Code available for payment source | Admitting: Internal Medicine

## 2013-04-04 ENCOUNTER — Other Ambulatory Visit: Payer: Self-pay | Admitting: Internal Medicine

## 2013-04-04 ENCOUNTER — Encounter: Payer: Self-pay | Admitting: Internal Medicine

## 2013-04-04 ENCOUNTER — Ambulatory Visit (INDEPENDENT_AMBULATORY_CARE_PROVIDER_SITE_OTHER): Payer: No Typology Code available for payment source | Admitting: Internal Medicine

## 2013-04-04 VITALS — BP 130/80 | HR 76 | Temp 97.0°F | Resp 16 | Ht 70.0 in | Wt 174.0 lb

## 2013-04-04 DIAGNOSIS — E785 Hyperlipidemia, unspecified: Secondary | ICD-10-CM

## 2013-04-04 MED ORDER — BUTALBITAL-APAP-CAFFEINE 50-325-40 MG PO TABS: ORAL_TABLET | ORAL | Status: DC

## 2013-04-04 MED ORDER — LOVASTATIN 40 MG PO TABS: 40.0000 mg | ORAL_TABLET | Freq: Every day | ORAL | Status: DC

## 2013-04-04 NOTE — Patient Instructions (Signed)
Discussed the mechanism of drug action and the need to bring the LDL to goal or better, 130 or less.  Plan Lovastatin 40 mg qPM  Lab in 3-4 weeks with recommendations to follow  Continue diet and exercise - THE BACK BONE OF TREATMENT

## 2013-04-04 NOTE — Progress Notes (Signed)
Pre visit review using our clinic review tool, if applicable. No additional management support is needed unless otherwise documented below in the visit note. 

## 2013-04-04 NOTE — Assessment & Plan Note (Addendum)
Discussed the mechanism of drug action and the need to bring the LDL to goal or better, 130 or less.  Plan Lovastatin 40 mg qPM  Lab in 3-4 weeks with recommendations to follow  (greater than 50% of 25 visit spent on education and counseling)

## 2013-04-04 NOTE — Progress Notes (Signed)
   Subjective:    Patient ID: Edwin Carroll, male    DOB: [birth year], 53 y.o.   MRN: [id number]  HPI Mr. Montez MoritaCarter was recently seen for CPX. Routine lab revealed LDL = 191. He presents to discuss treatment options.  PMH, FamHx and SocHx reviewed for any changes and relevance.  Current Outpatient Prescriptions on File Prior to Visit  Medication Sig Dispense Refill  . Azelaic Acid (FINACEA) 15 % cream Apply topically 2 (two) times daily. After skin is thoroughly washed and patted dry, gently but thoroughly massage a thin film of azelaic acid cream into the affected area twice daily, in the morning and evening.      . cyclobenzaprine (FLEXERIL) 10 MG tablet Take 10 mg by mouth 2 (two) times daily as needed for muscle spasms.       . Famotidine-Ca Carb-Mag Hydrox (PEPCID COMPLETE PO) Take by mouth at bedtime.      . fluticasone (FLONASE) 50 MCG/ACT nasal spray USE 1 SPRAY IN EACH NOSTRIL EVERY DAY  16 g  11  . HYDROcodone-acetaminophen (NORCO) 10-325 MG per tablet Take 1 tablet by mouth 2 (two) times daily.      Marland Kitchen. omeprazole (PRILOSEC) 40 MG capsule Take 1 capsule (40 mg total) by mouth daily.  30 capsule  11   No current facility-administered medications on file prior to visit.      Review of Systems No review - see recent note    Objective:   Physical Exam Filed Vitals:   04/04/13 0818  BP: 130/80  Pulse: 76  Temp: 97 F (36.1 C)  Resp: 16   Gen'l- WNWD alert man in no distress Neuro - A&O x 3       Assessment & Plan:

## 2013-04-07 ENCOUNTER — Encounter: Payer: Self-pay | Admitting: Internal Medicine
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
## 2013-06-20 ENCOUNTER — Telehealth: Payer: Self-pay

## 2013-06-20 NOTE — Telephone Encounter (Signed)
Received refill request from [facility]  request refills forbutal-acet 50-325-40 mg . Rx last written 04/04/13 #120/2 rf and pt last seen 03/2013 (former Norins)   . Please advise Thanks

## 2013-06-20 NOTE — Telephone Encounter (Signed)
I don't prescribe this medication

## 2013-06-21 NOTE — Telephone Encounter (Signed)
Pharmacy notified.

## 2013-06-27 ENCOUNTER — Ambulatory Visit (INDEPENDENT_AMBULATORY_CARE_PROVIDER_SITE_OTHER): Payer: BC Managed Care – PPO | Admitting: Internal Medicine

## 2013-06-27 ENCOUNTER — Encounter: Payer: Self-pay | Admitting: Internal Medicine

## 2013-06-27 VITALS — BP 122/80 | HR 78 | Temp 98.2°F | Resp 16 | Ht 70.0 in | Wt 167.0 lb

## 2013-06-27 DIAGNOSIS — J301 Allergic rhinitis due to pollen: Secondary | ICD-10-CM

## 2013-06-27 DIAGNOSIS — K120 Recurrent oral aphthae: Secondary | ICD-10-CM

## 2013-06-27 DIAGNOSIS — K219 Gastro-esophageal reflux disease without esophagitis: Secondary | ICD-10-CM

## 2013-06-27 DIAGNOSIS — G44209 Tension-type headache, unspecified, not intractable: Secondary | ICD-10-CM | POA: Insufficient documentation

## 2013-06-27 MED ORDER — FIRST-DUKES MOUTHWASH MT SUSP: 5.0000 mL | Freq: Four times a day (QID) | OROMUCOSAL | Status: DC | PRN

## 2013-06-27 MED ORDER — BUTALBITAL-APAP-CAFFEINE 50-325-40 MG PO TABS: ORAL_TABLET | ORAL | Status: DC

## 2013-06-27 NOTE — Progress Notes (Signed)
Pre visit review using our clinic review tool, if applicable. No additional management support is needed unless otherwise documented below in the visit note. 

## 2013-06-27 NOTE — Progress Notes (Signed)
Subjective:    Patient ID: Edwin Carroll, male    DOB: [birth year], 53 y.o.   MRN: [id number]  HPI Comments: He has had a mild ST for one week and now complains of painful sores on his buccal mucosa and around his tongue.     Review of Systems  Constitutional: Negative.  Negative for fever, chills, diaphoresis, activity change, appetite change, fatigue and unexpected weight change.  HENT: Positive for sore throat. Negative for congestion, drooling, facial swelling, sinus pressure, tinnitus, trouble swallowing and voice change.   Eyes: Negative.   Respiratory: Negative.  Negative for cough, choking, chest tightness, shortness of breath and stridor.   Cardiovascular: Negative.  Negative for chest pain, palpitations and leg swelling.  Gastrointestinal: Negative.  Negative for nausea, vomiting, abdominal pain, diarrhea, constipation and blood in stool.  Endocrine: Negative.   Genitourinary: Negative.   Musculoskeletal: Negative.  Negative for arthralgias, joint swelling and myalgias.  Skin: Negative.  Negative for rash.  Allergic/Immunologic: Negative.   Neurological: Positive for headaches. Negative for dizziness, tremors, seizures, syncope, facial asymmetry, speech difficulty, weakness, light-headedness and numbness.       Intermittent tension type headache for years  Hematological: Negative.  Negative for adenopathy. Does not bruise/bleed easily.  Psychiatric/Behavioral: Negative.        Objective:   Physical Exam  Vitals reviewed. Constitutional: He is oriented to person, place, and time. He appears well-developed and well-nourished. No distress.  HENT:  Head: Normocephalic and atraumatic.  Mouth/Throat: Oropharynx is clear and moist. Mucous membranes are not pale, not dry and not cyanotic. Oral lesions present. No trismus in the jaw. Normal dentition. No dental abscesses or uvula swelling. No oropharyngeal exudate, posterior oropharyngeal edema, posterior oropharyngeal erythema  or tonsillar abscesses.  There are erythematous ulcers on the buccal mucosa and around the tongue.  Eyes: Conjunctivae are normal. Right eye exhibits no discharge. Left eye exhibits no discharge. No scleral icterus.  Neck: Normal range of motion. Neck supple. No JVD present. No tracheal deviation present. No thyromegaly present.  Cardiovascular: Normal rate, regular rhythm, normal heart sounds and intact distal pulses.  Exam reveals no gallop and no friction rub.   No murmur heard. Pulmonary/Chest: Effort normal and breath sounds normal. No stridor. No respiratory distress. He has no wheezes. He has no rales. He exhibits no tenderness.  Abdominal: Soft. Bowel sounds are normal. He exhibits no distension and no mass. There is no tenderness. There is no rebound and no guarding.  Musculoskeletal: Normal range of motion. He exhibits no edema and no tenderness.  Lymphadenopathy:       Head (right side): No submental, no submandibular, no tonsillar, no preauricular, no posterior auricular and no occipital adenopathy present.       Head (left side): No submental, no submandibular, no tonsillar, no preauricular, no posterior auricular and no occipital adenopathy present.    He has no cervical adenopathy.       Right cervical: No superficial cervical, no deep cervical and no posterior cervical adenopathy present.      Left cervical: No superficial cervical, no deep cervical and no posterior cervical adenopathy present.    He has no axillary adenopathy.       Right: No supraclavicular adenopathy present.       Left: No supraclavicular adenopathy present.  Neurological: He is oriented to person, place, and time.  Skin: Skin is warm and dry. No rash noted. He is not diaphoretic. No erythema. No pallor.  Psychiatric: He has a  normal mood and affect. His behavior is normal. Judgment and thought content normal.     Lab Results  Component Value Date   WBC 5.2 06/26/2009   HGB 15.6 03/09/2013   HCT 46.6  03/09/2013   PLT 224.0 06/26/2009   GLUCOSE 102* 03/09/2013   CHOL 246* 03/09/2013   TRIG 123.0 03/09/2013   HDL 41.50 03/09/2013   LDLDIRECT 191.0 03/09/2013   LDLCALC 149* 12/20/2006   ALT 69* 03/09/2013   AST 32 03/09/2013   NA 140 03/09/2013   K 4.4 03/09/2013   CL 104 03/09/2013   CREATININE 0.8 03/09/2013   BUN 10 03/09/2013   CO2 29 03/09/2013   TSH 4.00 06/26/2009   PSA 0.29 06/26/2009       Assessment & Plan:

## 2013-06-27 NOTE — Patient Instructions (Signed)
Oral Ulcers °Oral ulcers are painful, shallow sores around the lining of the mouth. They can affect the gums, the inside of the lips and the cheeks (sores on the outside of the lips and on the face are different). They typically first occur in school aged children and teenagers. Oral ulcers may also be called canker sores or cold sores. °CAUSES  °Canker sores and cold sores can be caused by many factors including: °· Infection. °· Injury. °· Sun exposure. °· Medications. °· Emotional stress. °· Food allergies. °· Vitamin deficiencies. °· Toothpastes containing sodium lauryl sulfate. °The Herpes Virus can be the cause of mouth ulcers. The first infection can be severe and cause 10 or more ulcers on the gums, tongue and lips with fever and difficulty in swallowing. This infection usually occurs between the ages of 1 and 3 years.  °SYMPTOMS  °The typical sore is about ¼ inch (6 mm) in size, is an oval or round ulcer with red borders. °DIAGNOSIS  °Your caregiver can diagnose simple oral ulcers by examination. Additional testing is usually not required.  °TREATMENT  °Treatment is aimed at pain relief. Generally, oral ulcers resolve by themselves within 1 to 2 weeks without medication and are not contagious unless caused by Herpes (and other viruses). Antibiotics are not effective with mouth sores. Avoid direct contact with others until the ulcer is completely healed. See your caregiver for follow-up care as recommended. Also: °· Offer a soft diet. °· Encourage plenty of fluids to prevent dehydration. Popsicles and milk shakes can be helpful. °· Avoid acidic and salty foods and drinks such as orange juice. °· Infants and young children will often refuse to drink because of pain. Using a teaspoon, cup or syringe to give small amounts of fluids frequently can help prevent dehydration. °· Cold compresses on the face may help reduce pain. °· Pain medication can help control soreness. °· A solution of diphenhydramine mixed  with a liquid antacid can be useful to decrease the soreness of ulcers. Consult a caregiver for the dosing. °· Liquids or ointments with a numbing ingredient may be helpful when used as recommended. °· Older children and teenagers can rinse their mouth with a salt-water mixture (1/2 teaspoonof salt in 8 ounces of water) four times a day. This treatment is uncomfortable but may reduce the time the ulcers are present. °· There are many over the counter throat lozenges and medications available for oral ulcers. There effectiveness has not been studied. °· Consult your medical caregiver prior to using homeopathic treatments for oral ulcers. °SEEK MEDICAL CARE IF:  °· You think your child needs to be seen. °· The pain worsens and you cannot control it. °· There are 4 or more ulcers. °· The lips and gums begin to bleed and crust. °· A single mouth ulcer is near a tooth that is causing a toothache or pain. °· Your child has a fever, swollen face, or swollen glands. °· The ulcers began after starting a medication. °· Mouth ulcers keep re-occurring or last more than 2 weeks. °· You think your child is not taking adequate fluids. °SEEK IMMEDIATE MEDICAL CARE IF:  °· Your child has a high fever. °· Your child is unable to swallow or becomes dehydrated. °· Your child looks or acts very ill. °· An ulcer caused by a chemical your child accidentally put in their mouth. °Document Released: 02/20/2004 Document Revised: 04/06/2011 Document Reviewed: 10/04/2008 °ExitCare® Patient Information ©2014 [employer]. ° °

## 2013-06-28 ENCOUNTER — Encounter: Payer: Self-pay | Admitting: Internal Medicine

## 2013-06-28 NOTE — Assessment & Plan Note (Signed)
Will treat with MMW

## 2013-06-28 NOTE — Assessment & Plan Note (Signed)
Cont fioricet as needed

## 2013-07-20 ENCOUNTER — Other Ambulatory Visit: Payer: Self-pay | Admitting: Internal Medicine

## 2013-08-18 ENCOUNTER — Other Ambulatory Visit: Payer: Self-pay | Admitting: Internal Medicine

## 2013-08-30 ENCOUNTER — Telehealth: Payer: Self-pay | Admitting: *Deleted

## 2013-08-30 NOTE — Telephone Encounter (Signed)
Left msg on triage stating still having thrush on his tongue, been using the nystatin up to four times a day. Requesting what else he can take/use. Can't afford another office visit due to high insurance deductible...Raechel Chute/lmb

## 2013-08-30 NOTE — Telephone Encounter (Signed)
I did not treat him for thrush He was treated for aphthous ulcers If it is thrush then he will need a thorough work up with labs

## 2013-08-30 NOTE — Telephone Encounter (Signed)
Notified pt gave md response. Made appt for tomorrow @ 4:00...Raechel Chute/lmb

## 2013-08-31 ENCOUNTER — Encounter: Payer: Self-pay | Admitting: Internal Medicine

## 2013-08-31 ENCOUNTER — Ambulatory Visit (INDEPENDENT_AMBULATORY_CARE_PROVIDER_SITE_OTHER): Payer: BC Managed Care – PPO | Admitting: Internal Medicine

## 2013-08-31 ENCOUNTER — Other Ambulatory Visit (INDEPENDENT_AMBULATORY_CARE_PROVIDER_SITE_OTHER): Payer: BC Managed Care – PPO

## 2013-08-31 VITALS — BP 136/88 | HR 73 | Temp 98.5°F | Resp 16 | Ht 70.0 in | Wt 163.0 lb

## 2013-08-31 DIAGNOSIS — K143 Hypertrophy of tongue papillae: Secondary | ICD-10-CM

## 2013-08-31 DIAGNOSIS — R7309 Other abnormal glucose: Secondary | ICD-10-CM

## 2013-08-31 DIAGNOSIS — E785 Hyperlipidemia, unspecified: Secondary | ICD-10-CM

## 2013-08-31 LAB — CBC WITH DIFFERENTIAL/PLATELET
Basophils Absolute: 0 10*3/uL (ref 0.0–0.1)
Basophils Relative: 0.4 % (ref 0.0–3.0)
EOS PCT: 1 % (ref 0.0–5.0)
Eosinophils Absolute: 0.1 10*3/uL (ref 0.0–0.7)
HCT: 45.6 % (ref 39.0–52.0)
Hemoglobin: 15.3 g/dL (ref 13.0–17.0)
LYMPHS ABS: 2.3 10*3/uL (ref 0.7–4.0)
Lymphocytes Relative: 29 % (ref 12.0–46.0)
MCHC: 33.4 g/dL (ref 30.0–36.0)
MCV: 95.5 fl (ref 78.0–100.0)
Monocytes Absolute: 0.6 10*3/uL (ref 0.1–1.0)
Monocytes Relative: 7.4 % (ref 3.0–12.0)
NEUTROS PCT: 62.2 % (ref 43.0–77.0)
Neutro Abs: 4.9 10*3/uL (ref 1.4–7.7)
Platelets: 294 10*3/uL (ref 150.0–400.0)
RBC: 4.78 Mil/uL (ref 4.22–5.81)
RDW: 11.8 % (ref 11.5–15.5)
WBC: 7.9 10*3/uL (ref 4.0–10.5)

## 2013-08-31 LAB — HEPATIC FUNCTION PANEL
ALT: 38 U/L (ref 0–53)
AST: 23 U/L (ref 0–37)
Albumin: 5 g/dL (ref 3.5–5.2)
Alkaline Phosphatase: 69 U/L (ref 39–117)
Bilirubin, Direct: 0.1 mg/dL (ref 0.0–0.3)
TOTAL PROTEIN: 7.5 g/dL (ref 6.0–8.3)
Total Bilirubin: 0.6 mg/dL (ref 0.2–1.2)

## 2013-08-31 LAB — BASIC METABOLIC PANEL
BUN: 10 mg/dL (ref 6–23)
CO2: 26 mEq/L (ref 19–32)
Calcium: 9.6 mg/dL (ref 8.4–10.5)
Chloride: 101 mEq/L (ref 96–112)
Creatinine, Ser: 0.7 mg/dL (ref 0.4–1.5)
GFR: 124.93 mL/min (ref >60.00)
GLUCOSE: 90 mg/dL (ref 70–99)
POTASSIUM: 3.5 meq/L (ref 3.5–5.1)
Sodium: 138 mEq/L (ref 135–145)

## 2013-08-31 LAB — LIPID PANEL
CHOLESTEROL: 206 mg/dL — AB (ref 0–200)
HDL: 40.7 mg/dL (ref >39.00)
LDL Cholesterol: 155 mg/dL — ABNORMAL HIGH (ref 0–99)
NONHDL: 165.3
TRIGLYCERIDES: 50 mg/dL (ref 0.0–149.0)
Total CHOL/HDL Ratio: 5
VLDL: 10 mg/dL (ref 0.0–40.0)

## 2013-08-31 LAB — HEMOGLOBIN A1C: Hgb A1c MFr Bld: 5.3 % (ref 4.6–6.5)

## 2013-08-31 MED ORDER — CHLORHEXIDINE GLUCONATE 0.12 % MT SOLN: 15.0000 mL | Freq: Two times a day (BID) | OROMUCOSAL | Status: DC

## 2013-08-31 NOTE — Progress Notes (Signed)
   Subjective:    Patient ID: Edwin ChapmanRandolph Digangi, male    DOB: September 23, 1960, 53 y.o.   MRN: [id number]  HPI  He returns and complains that there is a white coating on his tongue and that it feels weird. There was previously some concern about oral thrush so he tried nystatin with not much relief.  Review of Systems  Constitutional: Negative.  Negative for fever, chills, diaphoresis, appetite change and fatigue.  HENT: Negative.  Negative for congestion, mouth sores, postnasal drip, rhinorrhea, sore throat, trouble swallowing and voice change.   Eyes: Negative.   Respiratory: Negative.   Cardiovascular: Negative.  Negative for chest pain.  Gastrointestinal: Negative.   Endocrine: Negative.   Genitourinary: Negative.   Musculoskeletal: Negative.   Skin: Negative.   Allergic/Immunologic: Negative.   Neurological: Negative.   Hematological: Negative.   Psychiatric/Behavioral: Negative.        Objective:   Physical Exam  Vitals reviewed. Constitutional: He is oriented to person, place, and time. He appears well-developed and well-nourished.  Non-toxic appearance. He does not have a sickly appearance. He does not appear ill. No distress.  HENT:  Head: Normocephalic and atraumatic.  Mouth/Throat: Oropharynx is clear and moist. Mucous membranes are not pale, not dry and not cyanotic. No oral lesions. No trismus in the jaw. No uvula swelling. No oropharyngeal exudate, posterior oropharyngeal edema, posterior oropharyngeal erythema or tonsillar abscesses.  His tongue has a faint white coating and there is halitosis.  Eyes: Conjunctivae are normal. Right eye exhibits no discharge. Left eye exhibits no discharge. No scleral icterus.  Neck: Normal range of motion. Neck supple. No JVD present. No tracheal deviation present. No thyromegaly present.  Cardiovascular: Normal rate, regular rhythm, normal heart sounds and intact distal pulses.  Exam reveals no gallop and no friction rub.   No murmur  heard. Pulmonary/Chest: Effort normal and breath sounds normal. No stridor. No respiratory distress. He has no wheezes. He has no rales. He exhibits no tenderness.  Abdominal: Soft. Bowel sounds are normal. He exhibits no distension and no mass. There is no tenderness. There is no rebound and no guarding.  Musculoskeletal: He exhibits no edema.  Lymphadenopathy:    He has no cervical adenopathy.  Neurological: He is oriented to person, place, and time.  Skin: Skin is warm and dry. No rash noted. He is not diaphoretic. No erythema. No pallor.  Psychiatric: He has a normal mood and affect. His behavior is normal. Judgment and thought content normal.     Lab Results  Component Value Date   WBC 5.2 06/26/2009   HGB 15.6 03/09/2013   HCT 46.6 03/09/2013   PLT 224.0 06/26/2009   GLUCOSE 102* 03/09/2013   CHOL 246* 03/09/2013   TRIG 123.0 03/09/2013   HDL 41.50 03/09/2013   LDLDIRECT 191.0 03/09/2013   LDLCALC 149* 12/20/2006   ALT 69* 03/09/2013   AST 32 03/09/2013   NA 140 03/09/2013   K 4.4 03/09/2013   CL 104 03/09/2013   CREATININE 0.8 03/09/2013   BUN 10 03/09/2013   CO2 29 03/09/2013   TSH 4.00 06/26/2009   PSA 0.29 06/26/2009       Assessment & Plan:

## 2013-08-31 NOTE — Patient Instructions (Signed)
Glossitis °Glossitis is an inflammation of the tongue. Changes in the appearance of the tongue may be a primary tongue disorder. This means the problem is only in the tongue. Glossitis may be a symptom of other disorders. °CAUSES  °· Excessive alcohol. °· Multiple allergies. °· Infections. °· Tobacco and nicotine use. °· Anemia. °· Mechanical injury. °· Spicy foods. °· Vitamin B deficiency. °· Damage from chemicals or hot food or drink. °SYMPTOMS  °There may be swelling and color changes in the tongue. Sometimes the surface of the tongue may look smooth. This disorder may be painless. But the tongue is usually sore and tender. It can be fiery red if condition is caused by deficiency of B vitamins. It is sometimes pale if there is anemia. Anemia means there are not enough red blood cells. There may be problems with chewing, swallowing and speaking. In some cases, glossitis may result in severe tongue swelling that blocks the airway. °DIAGNOSIS  °The diagnosis of glossitis is made easily by physical exam and asking for a history. Sometimes blood tests may be done. °TREATMENT  °· The goal of treatment is to reduce inflammation. Hospitalization is usually not necessary unless tongue swelling is severe. °· Good oral hygiene is important. This means good tooth brushing at least twice a day, and flossing daily for treatment and prevention. °· Corticosteroids such as prednisone may be given to reduce the redness and soreness. °· Medications may be prescribed if the cause of glossitis is an infection. °· Other problems such as anemia and nutritional deficiencies are treated. This may be a dietary change or vitamin supplements. Avoid hot or spicy foods, alcohol, and tobacco. This lessens the discomfort. °· Avoid anything that is irritating to your mouth or tongue. °Glossitis usually responds well to treatment if the cause of it is removed or treated.  °SEEK IMMEDIATE MEDICAL CARE IF:  °· Symptoms of glossitis persist for  longer than 10 days. °· Tongue swelling is severe and breathing, speaking, chewing, or swallowing difficulties are present. °· You have no relief from medications given. °· You develop difficulties breathing. °Document Released: 01/02/2002 Document Revised: 04/06/2011 Document Reviewed: 02/17/2008 °ExitCare® Patient Information ©2015 [employer]. This information is not intended to replace advice given to you by your health care provider. Make sure you discuss any questions you have with your health care provider. ° °

## 2013-08-31 NOTE — Progress Notes (Signed)
Pre visit review using our clinic review tool, if applicable. No additional management support is needed unless otherwise documented below in the visit note. 

## 2013-09-01 LAB — VITAMIN B12: VITAMIN B 12: 606 pg/mL (ref 211–911)

## 2013-09-01 LAB — TSH: TSH: 2.05 u[IU]/mL (ref 0.35–4.50)

## 2013-09-03 ENCOUNTER — Encounter: Payer: Self-pay | Admitting: Internal Medicine

## 2013-09-03 NOTE — Assessment & Plan Note (Signed)
I will check his A1C to see if he has developed DM2 

## 2013-09-03 NOTE — Assessment & Plan Note (Addendum)
I will check his B vitamins to see if there is a B vit deficiency to explain this Will screen for anemia, diabetes, thyroid disease Will try peridex oral rinse

## 2013-09-04 LAB — VITAMIN B6: Vitamin B6: 47.4 ng/mL — ABNORMAL HIGH (ref 2.1–21.7)

## 2013-09-05 ENCOUNTER — Encounter: Payer: Self-pay | Admitting: Internal Medicine

## 2013-09-05 LAB — VITAMIN B1: Vitamin B1 (Thiamine): 16 nmol/L (ref 8–30)

## 2013-09-20 ENCOUNTER — Other Ambulatory Visit: Payer: Self-pay | Admitting: Internal Medicine

## 2013-11-29 ENCOUNTER — Other Ambulatory Visit: Payer: Self-pay | Admitting: Internal Medicine

## 2013-11-29 NOTE — Telephone Encounter (Signed)
Refill request for butalbital-acetaminophen-caffeine  Last filled by MD on- 09/20/13 #60 x2 Last Appt: 08/31/2013 Next Appt: none Please advise refill?

## 2014-01-23 ENCOUNTER — Other Ambulatory Visit: Payer: Self-pay | Admitting: Internal Medicine

## 2014-01-24 NOTE — Telephone Encounter (Signed)
#  30 until Dr Yetta BarreJones back

## 2014-01-24 NOTE — Telephone Encounter (Signed)
MD is out of office pls advise on refill.../lmb 

## 2014-02-22 ENCOUNTER — Telehealth: Payer: Self-pay

## 2014-02-22 NOTE — Telephone Encounter (Signed)
PA for omeprazole and flonase sent via cover my meds.

## 2014-02-26 ENCOUNTER — Other Ambulatory Visit: Payer: Self-pay

## 2014-02-26 MED ORDER — FLUTICASONE PROPIONATE 50 MCG/ACT NA SUSP: NASAL | Status: DC

## 2014-02-26 NOTE — Addendum Note (Signed)
Addended by: Etta GrandchildJONES, Carver Murakami L on: 02/26/2014 10:13 AM   Modules accepted: Orders

## 2014-02-28 ENCOUNTER — Other Ambulatory Visit: Payer: Self-pay | Admitting: Internal Medicine

## 2014-02-28 MED ORDER — OMEPRAZOLE 40 MG PO CPDR: 40.0000 mg | DELAYED_RELEASE_CAPSULE | Freq: Every day | ORAL | Status: DC

## 2014-02-28 MED ORDER — BUTALBITAL-APAP-CAFFEINE 50-325-40 MG PO TABS: ORAL_TABLET | ORAL | Status: DC

## 2014-02-28 NOTE — Telephone Encounter (Signed)
[facility].../lmb 

## 2014-02-28 NOTE — Telephone Encounter (Signed)
Left msg on triage been trying to get refill on his medication. Requesting call bck. Called pt back he stated he is needing refill on his omeprazole & generic Fioricet pharmacy hasn't received response. Inform pt only receive request for the butalbital which is waiting on approval by md. [facility]. Is iot ok to refill bubalbital.../lmb

## 2014-03-07 NOTE — Telephone Encounter (Signed)
PA not required for any of the two meds listed.

## 2014-04-17 ENCOUNTER — Other Ambulatory Visit: Payer: Self-pay | Admitting: Internal Medicine

## 2014-05-01 ENCOUNTER — Ambulatory Visit (INDEPENDENT_AMBULATORY_CARE_PROVIDER_SITE_OTHER): Payer: BLUE CROSS/BLUE SHIELD | Admitting: Internal Medicine

## 2014-05-01 ENCOUNTER — Encounter: Payer: Self-pay | Admitting: Internal Medicine

## 2014-05-01 VITALS — BP 138/88 | HR 85 | Temp 98.0°F | Resp 16 | Wt 163.0 lb

## 2014-05-01 DIAGNOSIS — F329 Major depressive disorder, single episode, unspecified: Secondary | ICD-10-CM | POA: Diagnosis not present

## 2014-05-01 DIAGNOSIS — F32A Depression, unspecified: Secondary | ICD-10-CM | POA: Insufficient documentation

## 2014-05-01 DIAGNOSIS — F450 Somatization disorder: Secondary | ICD-10-CM

## 2014-05-01 MED ORDER — TRAZODONE HCL 150 MG PO TABS: 150.0000 mg | ORAL_TABLET | Freq: Every day | ORAL | Status: DC

## 2014-05-01 NOTE — Progress Notes (Signed)
Pre visit review using our clinic review tool, if applicable. No additional management support is needed unless otherwise documented below in the visit note. 

## 2014-05-01 NOTE — Patient Instructions (Signed)

## 2014-05-02 NOTE — Progress Notes (Signed)
Subjective:    Patient ID: Edwin ChapmanRandolph Carroll, male    DOB: 29-Aug-1960, 54 y.o.   MRN: [id number]  HPI Comments: He is experiencing a lot stress at work and complains of anxiety, insomnia, sadness, anhedonia, weight loss, loss of appetite, and intermittent nausea. He feels like he can't return to work at this time.  Anxiety Symptoms include nausea and nervous/anxious behavior. Patient reports no chest pain, confusion, decreased concentration, palpitations, shortness of breath or suicidal ideas.        Review of Systems  Constitutional: Positive for fatigue. Negative for fever, chills, diaphoresis, activity change, appetite change and unexpected weight change.  HENT: Negative.  Negative for trouble swallowing.   Eyes: Negative.   Respiratory: Negative.  Negative for cough, choking, chest tightness, shortness of breath and stridor.   Cardiovascular: Negative.  Negative for chest pain, palpitations and leg swelling.  Gastrointestinal: Positive for nausea. Negative for vomiting, abdominal pain, diarrhea, constipation, blood in stool, abdominal distention, anal bleeding and rectal pain.  Endocrine: Negative.   Genitourinary: Negative.   Musculoskeletal: Negative.  Negative for back pain and arthralgias.  Skin: Negative.  Negative for rash.  Allergic/Immunologic: Negative.   Neurological: Negative.   Hematological: Negative.  Negative for adenopathy. Does not bruise/bleed easily.  Psychiatric/Behavioral: Positive for sleep disturbance and dysphoric mood. Negative for suicidal ideas, hallucinations, behavioral problems, confusion, self-injury, decreased concentration and agitation. The patient is nervous/anxious. The patient is not hyperactive.        Objective:   Physical Exam  Constitutional: He is oriented to person, place, and time. He appears well-developed and well-nourished.  Non-toxic appearance. He does not have a sickly appearance. He does not appear ill. No distress.  HENT:    Head: Normocephalic and atraumatic.  Mouth/Throat: Oropharynx is clear and moist. No oropharyngeal exudate.  Eyes: Conjunctivae are normal. Right eye exhibits no discharge. Left eye exhibits no discharge. No scleral icterus.  Neck: Normal range of motion. Neck supple. No JVD present. No tracheal deviation present. No thyromegaly present.  Cardiovascular: Normal rate, regular rhythm, normal heart sounds and intact distal pulses.  Exam reveals no gallop and no friction rub.   No murmur heard. Pulmonary/Chest: Effort normal and breath sounds normal. No stridor. No respiratory distress. He has no wheezes. He has no rales. He exhibits no tenderness.  Abdominal: Soft. Bowel sounds are normal. He exhibits no distension and no mass. There is no tenderness. There is no rebound and no guarding.  Musculoskeletal: Normal range of motion. He exhibits no edema or tenderness.  Lymphadenopathy:    He has no cervical adenopathy.  Neurological: He is oriented to person, place, and time.  Skin: Skin is warm and dry. No rash noted. He is not diaphoretic. No erythema. No pallor.  Psychiatric: His speech is normal and behavior is normal. Judgment and thought content normal. His mood appears anxious. His affect is not angry, not blunt, not labile and not inappropriate. He is not agitated, not aggressive, not slowed and not withdrawn. Cognition and memory are normal. He exhibits a depressed mood. He expresses no homicidal and no suicidal ideation. He expresses no suicidal plans and no homicidal plans. He is attentive.  Vitals reviewed.    Lab Results  Component Value Date   WBC 7.9 08/31/2013   HGB 15.3 08/31/2013   HCT 45.6 08/31/2013   PLT 294.0 08/31/2013   GLUCOSE 90 08/31/2013   CHOL 206* 08/31/2013   TRIG 50.0 08/31/2013   HDL 40.70 08/31/2013   LDLDIRECT 191.0  03/09/2013   LDLCALC 155* 08/31/2013   ALT 38 08/31/2013   AST 23 08/31/2013   NA 138 08/31/2013   K 3.5 08/31/2013   CL 101 08/31/2013    CREATININE 0.7 08/31/2013   BUN 10 08/31/2013   CO2 26 08/31/2013   TSH 2.05 08/31/2013   PSA 0.29 06/26/2009   HGBA1C 5.3 08/31/2013       Assessment & Plan:

## 2014-05-02 NOTE — Assessment & Plan Note (Signed)
Will start trazodone at 150 mg qHS and I anticipate increasing the dose over time I think he should be out of work for two weeks and should consider applying for FMLA Will send for psychotherapy Will recheck in 3-4 weeks

## 2014-05-11 ENCOUNTER — Telehealth: Payer: Self-pay | Admitting: Internal Medicine

## 2014-05-11 NOTE — Telephone Encounter (Signed)
Patient called and needs a work note so that he can work 3 days a week for 2 weeks and return full time on May 2. He also need refill on butalbital-acetaminophen-caffeine (FIORICET, ESGIC) 50-325-40 MG per tablet [[id number]][[id number]] . [facility]acy is [facility] [address]

## 2014-05-13 MED ORDER — BUTALBITAL-APAP-CAFFEINE 50-325-40 MG PO TABS: ORAL_TABLET | ORAL | Status: DC

## 2014-05-13 NOTE — Telephone Encounter (Signed)
This is ok with me  

## 2014-05-14 NOTE — Telephone Encounter (Signed)
Called and left message asking pt to call back----letter has been approved and written---ask pt if he wants to come to office to pick letter up or just have letter mailed, also rx for Fioricet has been approved---does he want to pick up rx with letter or rx faxed [facility]

## 2014-05-14 NOTE — Telephone Encounter (Signed)
rx faxed to pt pharm, letter is being signed by dr Yetta Barrejones, i have a note on letter to hold letter here in office for patient to pick up when he comes for his appt on 4/19

## 2014-05-14 NOTE — Telephone Encounter (Signed)
Patient would like the [facility] to [facility] [address] He will pick up letter tomorrow. He has an appointment with Dr. Yetta BarreJones tomorrow afternoon

## 2014-05-15 ENCOUNTER — Encounter: Payer: Self-pay | Admitting: Internal Medicine

## 2014-05-15 ENCOUNTER — Ambulatory Visit (INDEPENDENT_AMBULATORY_CARE_PROVIDER_SITE_OTHER): Payer: BLUE CROSS/BLUE SHIELD | Admitting: Internal Medicine

## 2014-05-15 VITALS — BP 148/88 | HR 90 | Temp 98.1°F | Resp 18 | Wt 164.0 lb

## 2014-05-15 DIAGNOSIS — F450 Somatization disorder: Secondary | ICD-10-CM | POA: Diagnosis not present

## 2014-05-15 DIAGNOSIS — F329 Major depressive disorder, single episode, unspecified: Secondary | ICD-10-CM | POA: Diagnosis not present

## 2014-05-15 DIAGNOSIS — F32A Depression, unspecified: Secondary | ICD-10-CM

## 2014-05-15 NOTE — Progress Notes (Signed)
Pre visit review using our clinic review tool, if applicable. No additional management support is needed unless otherwise documented below in the visit note. 

## 2014-05-16 NOTE — Assessment & Plan Note (Signed)
Improvement noted Will cont trazodone at the current dose He will return to work

## 2014-05-16 NOTE — Progress Notes (Signed)
   Subjective:    Patient ID: Edwin ChapmanRandolph Carroll, male    DOB: [birth year], 54 y.o.   MRN: [id number]  HPI Comments: He returns for f/up and recent diagnosis of depression - he feels much better and does not want to change the dose of trazodone. Some good things have occurred in his work place and he feels much better about his position there. He is willing to return to work at 3 days per week.     Review of Systems  All other systems reviewed and are negative.      Objective:   Physical Exam  Constitutional: He appears well-developed.  Non-toxic appearance. He does not have a sickly appearance. He does not appear ill. No distress.  Skin: He is not diaphoretic.  Psychiatric: He has a normal mood and affect. His behavior is normal. Thought content normal.  Vitals reviewed.         Assessment & Plan:

## 2014-06-22 ENCOUNTER — Other Ambulatory Visit: Payer: Self-pay | Admitting: Geriatric Medicine

## 2014-06-22 DIAGNOSIS — F450 Somatization disorder: Principal | ICD-10-CM

## 2014-06-22 DIAGNOSIS — F329 Major depressive disorder, single episode, unspecified: Secondary | ICD-10-CM

## 2014-06-22 DIAGNOSIS — F32A Depression, unspecified: Secondary | ICD-10-CM

## 2014-06-22 MED ORDER — TRAZODONE HCL 150 MG PO TABS: 150.0000 mg | ORAL_TABLET | Freq: Every day | ORAL | Status: DC

## 2014-06-22 MED ORDER — BUTALBITAL-APAP-CAFFEINE 50-325-40 MG PO TABS
ORAL_TABLET | ORAL | Status: DC
Start: 2014-06-22 — End: 2014-08-09

## 2014-08-09 ENCOUNTER — Encounter: Payer: Self-pay | Admitting: Internal Medicine

## 2014-08-09 ENCOUNTER — Ambulatory Visit (INDEPENDENT_AMBULATORY_CARE_PROVIDER_SITE_OTHER): Payer: BLUE CROSS/BLUE SHIELD | Admitting: Internal Medicine

## 2014-08-09 VITALS — BP 138/86 | HR 63 | Temp 98.3°F | Resp 16 | Ht 70.0 in | Wt 160.0 lb

## 2014-08-09 DIAGNOSIS — F450 Somatization disorder: Secondary | ICD-10-CM

## 2014-08-09 DIAGNOSIS — G44229 Chronic tension-type headache, not intractable: Secondary | ICD-10-CM | POA: Diagnosis not present

## 2014-08-09 DIAGNOSIS — F32A Depression, unspecified: Secondary | ICD-10-CM

## 2014-08-09 DIAGNOSIS — F329 Major depressive disorder, single episode, unspecified: Secondary | ICD-10-CM

## 2014-08-09 MED ORDER — BUTALBITAL-APAP-CAFFEINE 50-325-40 MG PO TABS: ORAL_TABLET | ORAL | Status: DC

## 2014-08-09 MED ORDER — TRAZODONE HCL 150 MG PO TABS: 150.0000 mg | ORAL_TABLET | Freq: Every day | ORAL | Status: DC

## 2014-08-09 NOTE — Progress Notes (Signed)
Pre visit review using our clinic review tool, if applicable. No additional management support is needed unless otherwise documented below in the visit note. 

## 2014-08-09 NOTE — Patient Instructions (Signed)

## 2014-08-09 NOTE — Progress Notes (Signed)
Subjective:  Patient ID: Edwin Carroll, male    DOB: 1960/05/17  Age: 54 y.o. MRN: [id number]  CC: Depression   HPI Edwin Carroll presents for for follow-up regarding insomnia. He recently was placed on trazodone and he tells me that he is doing much better. His sleep is better and his headaches are improving. He still occasionally takes a dose of Fioricet for headache discomfort but he says overall his headaches are less frequent and less severe. He is not complaining of any mood disorders today.  Outpatient Prescriptions Prior to Visit  Medication Sig Dispense Refill  . Azelaic Acid (FINACEA) 15 % cream Apply topically 2 (two) times daily. After skin is thoroughly washed and patted dry, gently but thoroughly massage a thin film of azelaic acid cream into the affected area twice daily, in the morning and evening.    . fluticasone (FLONASE) 50 MCG/ACT nasal spray USE 1 SPRAY IN EACH NOSTRIL EVERY DAY 16 g 5  . omeprazole (PRILOSEC) 40 MG capsule Take 1 capsule (40 mg total) by mouth daily. 30 capsule 11  . traMADol (ULTRAM) 50 MG tablet Take by mouth as needed.    . butalbital-acetaminophen-caffeine (FIORICET, ESGIC) 50-325-40 MG per tablet TAKE 1 TABLET EVERY 6 HOURS AS NEEDED HEADACHE 30 tablet 3  . traZODone (DESYREL) 150 MG tablet Take 1 tablet (150 mg total) by mouth at bedtime. 30 tablet 1  . cyclobenzaprine (FLEXERIL) 10 MG tablet Take 10 mg by mouth 2 (two) times daily as needed for muscle spasms.      No facility-administered medications prior to visit.    ROS Review of Systems  Constitutional: Negative.  Negative for fever, chills, diaphoresis, appetite change and fatigue.  HENT: Negative.   Eyes: Negative.   Respiratory: Negative.  Negative for cough, choking, chest tightness, shortness of breath and stridor.   Cardiovascular: Negative.  Negative for chest pain, palpitations and leg swelling.  Gastrointestinal: Negative.   Endocrine: Negative.   Genitourinary:  Negative.   Musculoskeletal: Negative.   Skin: Negative.   Allergic/Immunologic: Negative.   Neurological: Negative.  Negative for dizziness, tremors, weakness, light-headedness, numbness and headaches.  Hematological: Negative.  Negative for adenopathy. Does not bruise/bleed easily.  Psychiatric/Behavioral: Positive for sleep disturbance. Negative for suicidal ideas, behavioral problems, confusion, self-injury, dysphoric mood, decreased concentration and agitation. The patient is not nervous/anxious and is not hyperactive.     Objective:  BP 138/86 mmHg  Pulse 63  Temp(Src) 98.3 F (36.8 C) (Oral)  Resp 16  Ht 5\' 10"  (1.778 m)  Wt 160 lb (72.576 kg)  BMI 22.96 kg/m2  SpO2 99%  BP Readings from Last 3 Encounters:  08/09/14 138/86  05/15/14 148/88  05/01/14 138/88    Wt Readings from Last 3 Encounters:  08/09/14 160 lb (72.576 kg)  05/15/14 164 lb (74.39 kg)  05/01/14 163 lb (73.936 kg)    Physical Exam  Constitutional: He is oriented to person, place, and time. No distress.  HENT:  Mouth/Throat: Oropharynx is clear and moist. No oropharyngeal exudate.  Eyes: Conjunctivae are normal. Right eye exhibits no discharge. Left eye exhibits no discharge. No scleral icterus.  Neck: Normal range of motion. Neck supple. No JVD present. No tracheal deviation present. No thyromegaly present.  Cardiovascular: Normal rate, regular rhythm, normal heart sounds and intact distal pulses.  Exam reveals no gallop and no friction rub.   No murmur heard. Pulmonary/Chest: Effort normal and breath sounds normal. No stridor. No respiratory distress. He has no wheezes. He has  no rales. He exhibits no tenderness.  Abdominal: Soft. Bowel sounds are normal. He exhibits no distension and no mass. There is no tenderness. There is no rebound and no guarding.  Musculoskeletal: Normal range of motion. He exhibits no edema or tenderness.  Lymphadenopathy:    He has no cervical adenopathy.  Neurological: He  is alert and oriented to person, place, and time. He has normal reflexes. He displays normal reflexes. No cranial nerve deficit. Coordination normal.  Skin: Skin is warm and dry. No rash noted. He is not diaphoretic. No erythema. No pallor.  Psychiatric: He has a normal mood and affect. His speech is normal and behavior is normal. Judgment and thought content normal. His mood appears not anxious. He is not agitated and not slowed. Cognition and memory are normal. He does not exhibit a depressed mood. He expresses no homicidal and no suicidal ideation. He expresses no suicidal plans and no homicidal plans.    Lab Results  Component Value Date   WBC 7.9 08/31/2013   HGB 15.3 08/31/2013   HCT 45.6 08/31/2013   PLT 294.0 08/31/2013   GLUCOSE 90 08/31/2013   CHOL 206* 08/31/2013   TRIG 50.0 08/31/2013   HDL 40.70 08/31/2013   LDLDIRECT 191.0 03/09/2013   LDLCALC 155* 08/31/2013   ALT 38 08/31/2013   AST 23 08/31/2013   NA 138 08/31/2013   K 3.5 08/31/2013   CL 101 08/31/2013   CREATININE 0.7 08/31/2013   BUN 10 08/31/2013   CO2 26 08/31/2013   TSH 2.05 08/31/2013   PSA 0.29 06/26/2009   HGBA1C 5.3 08/31/2013    No results found.  Assessment & Plan:   Rhian was seen today for depression.  Diagnoses and all orders for this visit:  Depression with somatization- improvement noted with low dose trazodone. Will continue at this dose. Orders: -     traZODone (DESYREL) 150 MG tablet; Take 1 tablet (150 mg total) by mouth at bedtime.  Chronic tension-type headache, not intractable- headaches are improved with the addition of trazodone. Will continue Fioricet as needed. Orders: -     butalbital-acetaminophen-caffeine (FIORICET, ESGIC) 50-325-40 MG per tablet; TAKE 1 TABLET EVERY 6 HOURS AS NEEDED HEADACHE   I have discontinued Mr. Dumond cyclobenzaprine. I am also having him maintain his Azelaic Acid, fluticasone, omeprazole, traMADol, methocarbamol, traZODone, and  butalbital-acetaminophen-caffeine.  Meds ordered this encounter  Medications  . methocarbamol (ROBAXIN) 750 MG tablet    Sig: Take 750 mg by mouth 2 (two) times daily.    Refill:  1  . traZODone (DESYREL) 150 MG tablet    Sig: Take 1 tablet (150 mg total) by mouth at bedtime.    Dispense:  90 tablet    Refill:  3  . butalbital-acetaminophen-caffeine (FIORICET, ESGIC) 50-325-40 MG per tablet    Sig: TAKE 1 TABLET EVERY 6 HOURS AS NEEDED HEADACHE    Dispense:  30 tablet    Refill:  2     Follow-up: Return in about 4 months (around 12/10/2014).  Sanda Linger, MD

## 2014-09-20 ENCOUNTER — Other Ambulatory Visit: Payer: Self-pay

## 2014-09-20 DIAGNOSIS — G44229 Chronic tension-type headache, not intractable: Secondary | ICD-10-CM

## 2014-09-20 NOTE — Telephone Encounter (Signed)
OK to Rf? Please advise. Dr. Yetta Barre out of office. Last OV 08/09/14

## 2014-09-20 NOTE — Telephone Encounter (Signed)
OK #[id number] 

## 2014-09-21 MED ORDER — BUTALBITAL-APAP-CAFFEINE 50-325-40 MG PO TABS: ORAL_TABLET | ORAL | Status: DC

## 2014-10-19 ENCOUNTER — Other Ambulatory Visit: Payer: Self-pay

## 2014-10-19 DIAGNOSIS — G44229 Chronic tension-type headache, not intractable: Secondary | ICD-10-CM

## 2014-10-19 NOTE — Telephone Encounter (Signed)
Ok to rF? 

## 2014-10-21 MED ORDER — BUTALBITAL-APAP-CAFFEINE 50-325-40 MG PO TABS: ORAL_TABLET | ORAL | Status: DC

## 2014-10-22 NOTE — Telephone Encounter (Signed)
Rx faxed to [facility].../lmb 

## 2014-12-13 ENCOUNTER — Other Ambulatory Visit: Payer: Self-pay | Admitting: Internal Medicine

## 2014-12-13 DIAGNOSIS — G44229 Chronic tension-type headache, not intractable: Secondary | ICD-10-CM

## 2014-12-13 MED ORDER — BUTALBITAL-APAP-CAFFEINE 50-325-40 MG PO TABS: ORAL_TABLET | ORAL | Status: DC

## 2014-12-13 NOTE — Telephone Encounter (Signed)
Ok to fill 

## 2014-12-13 NOTE — Telephone Encounter (Signed)
Pt states pharmacy has been trying to get a refill for butalbital-acetaminophen-caffeine (FIORICET, ESGIC) 50-325-40 MG per tablet [[id number] Pharmacy is [facility] on [address]

## 2014-12-14 NOTE — Telephone Encounter (Signed)
PCP printed rx for pt.   Called and left vm with pharmacy to fill rx as listed.

## 2014-12-27 ENCOUNTER — Telehealth: Payer: Self-pay | Admitting: Gastroenterology

## 2014-12-27 NOTE — Telephone Encounter (Signed)
Pt states he has a h/o esophageal stricture and has had 2 dilations in the past. Pt states he is having problems again swallowing and over Thanksgiving he had trouble with food sticking and was uncomfortable for a while. Pt thinks he needs another dilation. Pt states he is taking prilosec 40mg  daily. Dr. Christella HartiganJacobs please advise.

## 2014-12-27 NOTE — Telephone Encounter (Signed)
EGD with balloon dilation, next available [facility] appt with me.  I think there is an opening tomorrow AM actually. HE should chew his food well, eat slowly and take small bites in the meantime.

## 2014-12-27 NOTE — Telephone Encounter (Signed)
Pt cannot do tomorrow. Pt scheduled for EGD with dil 01/01/15@10am , previsit tomorrow at 2pm. Pt aware of appts.

## 2014-12-28 ENCOUNTER — Ambulatory Visit (AMBULATORY_SURGERY_CENTER): Payer: Self-pay | Admitting: *Deleted

## 2014-12-28 VITALS — Ht 70.0 in | Wt 161.0 lb

## 2014-12-28 DIAGNOSIS — R1310 Dysphagia, unspecified: Secondary | ICD-10-CM

## 2014-12-28 NOTE — Progress Notes (Signed)
No egg or soy allergy known to patient  No issues with past sedation with any surgeries  or procedures, no intubation problems  No diet pills No home 02 use per patient   

## 2015-01-01 ENCOUNTER — Encounter: Payer: Self-pay | Admitting: Gastroenterology

## 2015-01-01 ENCOUNTER — Ambulatory Visit (AMBULATORY_SURGERY_CENTER): Payer: BLUE CROSS/BLUE SHIELD | Admitting: Gastroenterology

## 2015-01-01 VITALS — BP 106/73 | HR 61 | Temp 97.3°F | Resp 20 | Ht 70.0 in | Wt 161.0 lb

## 2015-01-01 DIAGNOSIS — R1310 Dysphagia, unspecified: Secondary | ICD-10-CM | POA: Diagnosis present

## 2015-01-01 DIAGNOSIS — Q394 Esophageal web: Secondary | ICD-10-CM

## 2015-01-01 MED ORDER — SODIUM CHLORIDE 0.9 % IV SOLN: 500.0000 mL | INTRAVENOUS | Status: DC

## 2015-01-01 NOTE — Patient Instructions (Signed)
YOU HAD AN ENDOSCOPIC PROCEDURE TODAY AT THE [facility]:   Refer to the procedure report that was given to you for any specific questions about what was found during the examination.  If the procedure report does not answer your questions, please call your gastroenterologist to clarify.  If you requested that your care partner not be given the details of your procedure findings, then the procedure report has been included in a sealed envelope for you to review at your convenience later.  YOU SHOULD EXPECT: Some feelings of bloating in the abdomen. Passage of more gas than usual.  Walking can help get rid of the air that was put into your GI tract during the procedure and reduce the bloating. If you had a lower endoscopy (such as a colonoscopy or flexible sigmoidoscopy) you may notice spotting of blood in your stool or on the toilet paper. If you underwent a bowel prep for your procedure, you may not have a normal bowel movement for a few days.  Please Note:  You might notice some irritation and congestion in your nose or some drainage.  This is from the oxygen used during your procedure.  There is no need for concern and it should clear up in a day or so.  SYMPTOMS TO REPORT IMMEDIATELY:    Following upper endoscopy (EGD)  Vomiting of blood or coffee ground material  New chest pain or pain under the shoulder blades  Painful or persistently difficult swallowing  New shortness of breath  Fever of 100F or higher  Black, tarry-looking stools  For urgent or emergent issues, a gastroenterologist can be reached at any hour by calling [phone].   DIET:  Clear liquids until 11am; Soft foods for the rest of today and tonight.  Regular diet tomorrow.  ACTIVITY:  You should plan to take it easy for the rest of today and you should NOT DRIVE or use heavy machinery until tomorrow (because of the sedation medicines used during the test).    FOLLOW UP: Our staff will call the number  listed on your records the next business day following your procedure to check on you and address any questions or concerns that you may have regarding the information given to you following your procedure. If we do not reach you, we will leave a message.  However, if you are feeling well and you are not experiencing any problems, there is no need to return our call.  We will assume that you have returned to your regular daily activities without incident.  If any biopsies were taken you will be contacted by phone or by letter within the next 1-3 weeks.  Please call us at [phone] if you have not heard about the biopsies in 3 weeks.    SIGNATURES/CONFIDENTIALITY: You and/or your care partner have signed paperwork which will be entered into your electronic medical record.  These signatures attest to the fact that that the information above on your After Visit Summary has been reviewed and is understood.  Full responsibility of the confidentiality of this discharge information lies with you and/or your care-partner.  Read all of your handouts given to you by your recovery room nurse.   Be sure to take your omeprazole 30 minutes before breakfast.

## 2015-01-01 NOTE — Progress Notes (Signed)
Patient awakening,vss,report to rn 

## 2015-01-01 NOTE — Progress Notes (Signed)
Called to room to assist during endoscopic procedure.  Patient ID and intended procedure confirmed with present staff. Received instructions for my participation in the procedure from the performing physician.  

## 2015-01-01 NOTE — Op Note (Signed)
[facility] [address] [place], [id number]   ENDOSCOPY PROCEDURE REPORT  PATIENT: Edwin Carroll, Edwin Carroll  MR#: [id number] BIRTHDATE: 04-May-1960 , 54  yrs. old GENDER: male ENDOSCOPIST: Rachael Feeaniel Carroll Jacobs, MD PROCEDURE DATE:  01/01/2015 PROCEDURE:  EGD w/ balloon dilation ASA CLASS:     Class II INDICATIONS:  intermittent solid food dysphagia, h/o LE ring that was dilated twice Dr.  Christella HartiganJacobs in 2012. MEDICATIONS: Monitored anesthesia care and Propofol 250 mg IV TOPICAL ANESTHETIC: none  DESCRIPTION OF PROCEDURE: After the risks benefits and alternatives of the procedure were thoroughly explained, informed consent was obtained.  The LB [id number] endoscope was introduced through the mouth and advanced to the second portion of the duodenum , Without limitations.  The instrument was slowly withdrawn as the mucosa was fully examined.  There was a 1-2cm hiatal hernia.  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  There was a slightly irregular z-line, non-nodular and <1cm (not biopsies).  The GE junction ring was dilated up to 19mm with a TTS balloon held inflated for 1 minute.  There was the usual superficial mucosal tear and self limited oozing of blood after dilation.  The examination was otherwise normal.  Retroflexed views revealed no abnormalities.     The scope was then withdrawn from the patient and the procedure completed.  COMPLICATIONS: There were no immediate complications.  ENDOSCOPIC IMPRESSION: There was a 1-2cm hiatal hernia.  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  There was a slightly irregular z-line, non-nodular and <1cm (not biopsies).  The GE junction ring was dilated up to 19mm with a TTS balloon held inflated for 1 minute.  There was the usual superficial mucosal tear and self limited oozing of blood after dilation.  The examination was otherwise  normal  RECOMMENDATIONS: Continue once daily PPI (omeprazole), this is best take 20-130min prior to breakfast meal.  Call for repeat dilation if needed.   eSigned:  Rachael Feeaniel Carroll Jacobs, MD 01/01/2015 9:56 AM

## 2015-01-02 ENCOUNTER — Telehealth: Payer: Self-pay | Admitting: *Deleted

## 2015-01-02 NOTE — Telephone Encounter (Signed)
  Follow up Call-  Call back number 01/01/2015  Post procedure Call Back phone  # [phone]  Permission to leave phone message Yes     Patient questions:  Do you have a fever, pain , or abdominal swelling? no Pain Score  0 *  Have you tolerated food without any problems? Yes.    Have you been able to return to your normal activities? Yes.    Do you have any questions about your discharge instructions: Diet   No. Medications  No. Follow up visit  No.  Do you have questions or concerns about your Care? No.  Actions: * If pain score is 4 or above: No action needed, pain <4.

## 2015-01-10 ENCOUNTER — Other Ambulatory Visit: Payer: Self-pay | Admitting: Internal Medicine

## 2015-01-12 ENCOUNTER — Other Ambulatory Visit: Payer: Self-pay | Admitting: Internal Medicine

## 2015-02-01 ENCOUNTER — Other Ambulatory Visit: Payer: Self-pay | Admitting: Internal Medicine

## 2015-02-06 ENCOUNTER — Telehealth: Payer: Self-pay

## 2015-02-06 ENCOUNTER — Other Ambulatory Visit: Payer: Self-pay | Admitting: Internal Medicine

## 2015-02-06 NOTE — Telephone Encounter (Signed)
Per PCP pt needs an appt. A 30 day supply has been written for a refill request. Has not been seen since July of 2016. Can be put on schedule for medication f/u.

## 2015-02-07 NOTE — Telephone Encounter (Signed)
lmovm to call back so we can make an appt

## 2015-03-04 ENCOUNTER — Ambulatory Visit: Payer: Self-pay | Admitting: Internal Medicine

## 2015-03-04 ENCOUNTER — Encounter: Payer: Self-pay | Admitting: Internal Medicine

## 2015-03-04 ENCOUNTER — Ambulatory Visit (INDEPENDENT_AMBULATORY_CARE_PROVIDER_SITE_OTHER): Payer: BLUE CROSS/BLUE SHIELD | Admitting: Internal Medicine

## 2015-03-04 ENCOUNTER — Other Ambulatory Visit (INDEPENDENT_AMBULATORY_CARE_PROVIDER_SITE_OTHER): Payer: BLUE CROSS/BLUE SHIELD

## 2015-03-04 VITALS — BP 136/88 | HR 67 | Temp 97.7°F | Resp 16 | Ht 70.0 in | Wt 159.0 lb

## 2015-03-04 DIAGNOSIS — G44229 Chronic tension-type headache, not intractable: Secondary | ICD-10-CM

## 2015-03-04 DIAGNOSIS — Z0000 Encounter for general adult medical examination without abnormal findings: Secondary | ICD-10-CM

## 2015-03-04 DIAGNOSIS — F450 Somatization disorder: Secondary | ICD-10-CM | POA: Diagnosis not present

## 2015-03-04 DIAGNOSIS — F329 Major depressive disorder, single episode, unspecified: Secondary | ICD-10-CM

## 2015-03-04 LAB — URINALYSIS, ROUTINE W REFLEX MICROSCOPIC
Bilirubin Urine: NEGATIVE
Hgb urine dipstick: NEGATIVE
KETONES UR: NEGATIVE
Leukocytes, UA: NEGATIVE
NITRITE: NEGATIVE
RBC / HPF: NONE SEEN (ref 0–?)
Total Protein, Urine: NEGATIVE
Urine Glucose: NEGATIVE
Urobilinogen, UA: 0.2 (ref 0.0–1.0)
WBC UA: NONE SEEN (ref 0–?)
pH: 7 (ref 5.0–8.0)

## 2015-03-04 LAB — CBC WITH DIFFERENTIAL/PLATELET
BASOS PCT: 0.7 % (ref 0.0–3.0)
Basophils Absolute: 0 10*3/uL (ref 0.0–0.1)
EOS PCT: 1.8 % (ref 0.0–5.0)
Eosinophils Absolute: 0.1 10*3/uL (ref 0.0–0.7)
HCT: 42.8 % (ref 39.0–52.0)
HEMOGLOBIN: 14.1 g/dL (ref 13.0–17.0)
LYMPHS ABS: 1.8 10*3/uL (ref 0.7–4.0)
Lymphocytes Relative: 28.8 % (ref 12.0–46.0)
MCHC: 33 g/dL (ref 30.0–36.0)
MCV: 95.4 fl (ref 78.0–100.0)
MONO ABS: 0.5 10*3/uL (ref 0.1–1.0)
MONOS PCT: 7.2 % (ref 3.0–12.0)
NEUTROS PCT: 61.5 % (ref 43.0–77.0)
Neutro Abs: 3.9 10*3/uL (ref 1.4–7.7)
Platelets: 352 10*3/uL (ref 150.0–400.0)
RBC: 4.48 Mil/uL (ref 4.22–5.81)
RDW: 12.5 % (ref 11.5–15.5)
WBC: 6.4 10*3/uL (ref 4.0–10.5)

## 2015-03-04 LAB — COMPREHENSIVE METABOLIC PANEL
ALBUMIN: 4.7 g/dL (ref 3.5–5.2)
ALK PHOS: 76 U/L (ref 39–117)
ALT: 34 U/L (ref 0–53)
AST: 22 U/L (ref 0–37)
BUN: 8 mg/dL (ref 6–23)
CHLORIDE: 105 meq/L (ref 96–112)
CO2: 29 mEq/L (ref 19–32)
Calcium: 9.5 mg/dL (ref 8.4–10.5)
Creatinine, Ser: 0.78 mg/dL (ref 0.40–1.50)
GFR: 109.65 mL/min (ref >60.00)
GLUCOSE: 98 mg/dL (ref 70–99)
POTASSIUM: 3.9 meq/L (ref 3.5–5.1)
SODIUM: 143 meq/L (ref 135–145)
TOTAL PROTEIN: 7.2 g/dL (ref 6.0–8.3)
Total Bilirubin: 0.3 mg/dL (ref 0.2–1.2)

## 2015-03-04 LAB — LIPID PANEL
CHOLESTEROL: 169 mg/dL (ref 0–200)
HDL: 43.7 mg/dL (ref >39.00)
LDL Cholesterol: 114 mg/dL — ABNORMAL HIGH (ref 0–99)
NonHDL: 124.92
Total CHOL/HDL Ratio: 4
Triglycerides: 53 mg/dL (ref 0.0–149.0)
VLDL: 10.6 mg/dL (ref 0.0–40.0)

## 2015-03-04 LAB — TSH: TSH: 1.59 u[IU]/mL (ref 0.35–4.50)

## 2015-03-04 LAB — FECAL OCCULT BLOOD, GUAIAC: FECAL OCCULT BLD: NEGATIVE

## 2015-03-04 LAB — HEMOGLOBIN A1C: HEMOGLOBIN A1C: 5.3 % (ref 4.6–6.5)

## 2015-03-04 LAB — PSA: PSA: 0.31 ng/mL (ref 0.10–4.00)

## 2015-03-04 MED ORDER — BUTALBITAL-APAP-CAFFEINE 50-325-40 MG PO TABS
ORAL_TABLET | ORAL | Status: DC
Start: 2015-03-04 — End: 2015-04-30

## 2015-03-04 MED ORDER — TRAZODONE HCL 150 MG PO TABS: 300.0000 mg | ORAL_TABLET | Freq: Every day | ORAL | Status: AC

## 2015-03-04 NOTE — Patient Instructions (Signed)

## 2015-03-04 NOTE — Progress Notes (Signed)
Pre visit review using our clinic review tool, if applicable. No additional management support is needed unless otherwise documented below in the visit note. 

## 2015-03-04 NOTE — Progress Notes (Signed)
Subjective:  Patient ID: Edwin Carroll, male    DOB: 09/10/1960  Age: 55 y.o. MRN: [id number]  CC: Annual Exam and Headache   HPI Edwin Carroll presents for a complete physical and follow-up on headaches. He has chronic, recurrent headaches that he describes as tension type headaches. He describes stress around his scalp and in his both temples and the occipital region.. The pain occurs intermittently and has not changed recently. He is taking butalbital for several years to control the symptoms. He is willing to see a headache specialist about this.  Outpatient Prescriptions Prior to Visit  Medication Sig Dispense Refill  . Azelaic Acid (FINACEA) 15 % cream Apply topically 2 (two) times daily. Reported on 03/04/2015    . fluticasone (FLONASE) 50 MCG/ACT nasal spray USE 1 SPRAY IN EACH NOSTRIL EVERY DAY 16 g 11  . methocarbamol (ROBAXIN) 750 MG tablet Take 750 mg by mouth 2 (two) times daily as needed.   1  . omeprazole (PRILOSEC) 40 MG capsule TAKE 1 CAPSULE (40 MG TOTAL) BY MOUTH DAILY. 30 capsule 11  . butalbital-acetaminophen-caffeine (FIORICET, ESGIC) 50-325-40 MG tablet TAKE 1 TABLET BY MOUTH EVERY 6 HOURS AS NEEDED FOR HEADACHE 30 tablet 0  . diclofenac (VOLTAREN) 50 MG EC tablet Take 50 mg by mouth 2 (two) times daily.    . traZODone (DESYREL) 150 MG tablet Take 1 tablet (150 mg total) by mouth at bedtime. 90 tablet 3  . traMADol (ULTRAM) 50 MG tablet Take by mouth as needed. Reported on 03/04/2015     No facility-administered medications prior to visit.    ROS Review of Systems  Constitutional: Negative.  Negative for fever, chills, diaphoresis, appetite change and fatigue.  HENT: Negative for sore throat, trouble swallowing and voice change.   Eyes: Negative.   Respiratory: Negative.  Negative for apnea, cough, choking, chest tightness, shortness of breath and stridor.   Cardiovascular: Negative.  Negative for chest pain, palpitations and leg swelling.  Gastrointestinal:  Negative.  Negative for nausea, vomiting, abdominal pain, diarrhea, constipation and blood in stool.  Endocrine: Negative.   Genitourinary: Negative.  Negative for dysuria, urgency, frequency, hematuria, flank pain, discharge, difficulty urinating and testicular pain.  Musculoskeletal: Negative.  Negative for myalgias, back pain, joint swelling, arthralgias and neck pain.  Skin: Negative.  Negative for color change, pallor and rash.  Neurological: Positive for headaches. Negative for dizziness, tremors, seizures, syncope, facial asymmetry, speech difficulty, weakness, light-headedness and numbness.  Hematological: Negative.  Negative for adenopathy. Does not bruise/bleed easily.  Psychiatric/Behavioral: Positive for sleep disturbance. Negative for suicidal ideas, hallucinations, behavioral problems, confusion, self-injury, dysphoric mood, decreased concentration and agitation. The patient is nervous/anxious. The patient is not hyperactive.     Objective:  BP 136/88 mmHg  Pulse 67  Temp(Src) 97.7 F (36.5 C) (Oral)  Resp 16  Ht  (1.778 m)  Wt 159 lb (72.122 kg)  BMI 22.81 kg/m2  SpO2 99%  BP Readings from Last 3 Encounters:  03/04/15 136/88  01/01/15 106/73  08/09/14 138/86    Wt Readings from Last 3 Encounters:  03/04/15 159 lb (72.122 kg)  01/01/15 161 lb (73.029 kg)  12/28/14 161 lb (73.029 kg)    Physical Exam  Constitutional: He is oriented to person, place, and time. He appears well-developed and well-nourished. No distress.  HENT:  Head: Normocephalic and atraumatic.  Mouth/Throat: Oropharynx is clear and moist. No oropharyngeal exudate.  Eyes: Conjunctivae are normal. Right eye exhibits no discharge. Left eye exhibits no discharge.  No scleral icterus.  Neck: Normal range of motion. Neck supple. No JVD present. No tracheal deviation present. No thyromegaly present.  Cardiovascular: Normal rate, regular rhythm, normal heart sounds and intact distal pulses.  Exam  reveals no gallop and no friction rub.   No murmur heard. Pulmonary/Chest: Effort normal and breath sounds normal. No stridor. No respiratory distress. He has no wheezes. He has no rales. He exhibits no tenderness.  Abdominal: Soft. Bowel sounds are normal. He exhibits no distension and no mass. There is no tenderness. There is no rebound and no guarding. Hernia confirmed negative in the right inguinal area and confirmed negative in the left inguinal area.  Genitourinary: Rectum normal, prostate normal, testes normal and penis normal. Rectal exam shows no external hemorrhoid, no internal hemorrhoid, no fissure, no mass, no tenderness and anal tone normal. Guaiac negative stool. Prostate is not enlarged and not tender. Right testis shows no mass, no swelling and no tenderness. Right testis is descended. Left testis shows no mass, no swelling and no tenderness. Left testis is descended. Circumcised. No penile erythema or penile tenderness. No discharge found.  Musculoskeletal: Normal range of motion. He exhibits no edema or tenderness.  Lymphadenopathy:    He has no cervical adenopathy.       Right: No inguinal adenopathy present.       Left: No inguinal adenopathy present.  Neurological: He is alert and oriented to person, place, and time. He has normal reflexes. He displays normal reflexes. No cranial nerve deficit. He exhibits normal muscle tone. Coordination normal.  Skin: Skin is warm and dry. No rash noted. He is not diaphoretic. No erythema. No pallor.  Psychiatric: He has a normal mood and affect. His behavior is normal. Judgment and thought content normal.  Vitals reviewed.   Lab Results  Component Value Date   WBC 6.4 03/04/2015   HGB 14.1 03/04/2015   HCT 42.8 03/04/2015   PLT 352.0 03/04/2015   GLUCOSE 98 03/04/2015   CHOL 169 03/04/2015   TRIG 53.0 03/04/2015   HDL 43.70 03/04/2015   LDLDIRECT 191.0 03/09/2013   LDLCALC 114* 03/04/2015   ALT 34 03/04/2015   AST 22 03/04/2015     NA 143 03/04/2015   K 3.9 03/04/2015   CL 105 03/04/2015   CREATININE 0.78 03/04/2015   BUN 8 03/04/2015   CO2 29 03/04/2015   TSH 1.59 03/04/2015   PSA 0.31 03/04/2015   HGBA1C 5.3 03/04/2015    No results found.  Assessment & Plan:   Daily was seen today for annual exam and headache.  Diagnoses and all orders for this visit:  Routine general medical examination at a health care facility- exam completed, labs ordered and reviewed, vaccines reviewed and updated, his colonoscopy is up-to-date, patient education materials were given. -     Lipid panel; Future -     Comprehensive metabolic panel; Future -     CBC with Differential/Platelet; Future -     TSH; Future -     Urinalysis, Routine w reflex microscopic (not at [facility]); Future -     Hemoglobin A1c; Future -     Hepatitis C antibody; Future -     PSA; Future  Depression with somatization- he will continue trazodone for insomnia, anxiety and to help with the ankle controlled the headaches. -     traZODone (DESYREL) 150 MG tablet; Take 2 tablets (300 mg total) by mouth at bedtime.  Chronic tension-type headache, not intractable- I've asked him to  see a headache specialist for further evaluation of this, for now will continue the butalbital as needed. I would like to taper him off of this but at this time he is not willing to lower his dose. -     butalbital-acetaminophen-caffeine (FIORICET, ESGIC) 50-325-40 MG tablet; TAKE 1 TABLET BY MOUTH EVERY 6 HOURS AS NEEDED FOR HEADACHE -     Ambulatory referral to Neurology   I have changed Mr. Brislin traZODone. I am also having him maintain his Azelaic Acid, traMADol, methocarbamol, omeprazole, fluticasone, diclofenac, and butalbital-acetaminophen-caffeine.  Meds ordered this encounter  Medications  . diclofenac (VOLTAREN) 75 MG EC tablet    Sig: Take 75 mg by mouth 2 (two) times daily with a meal.    Refill:  2  . butalbital-acetaminophen-caffeine (FIORICET, ESGIC)  50-325-40 MG tablet    Sig: TAKE 1 TABLET BY MOUTH EVERY 6 HOURS AS NEEDED FOR HEADACHE    Dispense:  30 tablet    Refill:  3  . traZODone (DESYREL) 150 MG tablet    Sig: Take 2 tablets (300 mg total) by mouth at bedtime.    Dispense:  180 tablet    Refill:  3     Follow-up: Return in about 6 months (around 09/01/2015).  Sanda Linger, MD

## 2015-03-05 ENCOUNTER — Encounter: Payer: Self-pay | Admitting: Internal Medicine

## 2015-03-05 LAB — HEPATITIS C ANTIBODY: HCV AB: NEGATIVE

## 2015-04-25 ENCOUNTER — Other Ambulatory Visit: Payer: Self-pay | Admitting: Internal Medicine

## 2015-04-30 ENCOUNTER — Other Ambulatory Visit: Payer: Self-pay | Admitting: Internal Medicine

## 2015-09-16 ENCOUNTER — Other Ambulatory Visit: Payer: Self-pay | Admitting: *Deleted

## 2015-09-16 MED ORDER — OMEPRAZOLE 40 MG PO CPDR: DELAYED_RELEASE_CAPSULE | ORAL | 1 refills | Status: AC

## 2016-01-02 ENCOUNTER — Other Ambulatory Visit: Payer: Self-pay | Admitting: Internal Medicine

## 2016-03-26 ENCOUNTER — Other Ambulatory Visit: Payer: Self-pay | Admitting: Internal Medicine
